# Patient Record
Sex: Male | Born: 1975 | Race: White | Hispanic: No | Marital: Married | Smoking: Former smoker
Health system: Southern US, Community
[De-identification: ages and names within clinical notes are randomized; demographics above are authoritative.]

## PROBLEM LIST (undated history)

## (undated) DIAGNOSIS — G4733 Obstructive sleep apnea (adult) (pediatric): Secondary | ICD-10-CM

## (undated) DIAGNOSIS — F329 Major depressive disorder, single episode, unspecified: Secondary | ICD-10-CM

## (undated) DIAGNOSIS — G8929 Other chronic pain: Secondary | ICD-10-CM

## (undated) DIAGNOSIS — I10 Essential (primary) hypertension: Secondary | ICD-10-CM

## (undated) DIAGNOSIS — I2699 Other pulmonary embolism without acute cor pulmonale: Secondary | ICD-10-CM

## (undated) DIAGNOSIS — Z86711 Personal history of pulmonary embolism: Secondary | ICD-10-CM

## (undated) DIAGNOSIS — G629 Polyneuropathy, unspecified: Secondary | ICD-10-CM

## (undated) DIAGNOSIS — F32A Depression, unspecified: Secondary | ICD-10-CM

## (undated) DIAGNOSIS — K219 Gastro-esophageal reflux disease without esophagitis: Secondary | ICD-10-CM

## (undated) HISTORY — DX: Morbid (severe) obesity due to excess calories: E66.01

## (undated) HISTORY — DX: Personal history of pulmonary embolism: Z86.711

## (undated) HISTORY — DX: Obstructive sleep apnea (adult) (pediatric): G47.33

## (undated) HISTORY — DX: Essential (primary) hypertension: I10

## (undated) HISTORY — DX: Major depressive disorder, single episode, unspecified: F32.9

## (undated) HISTORY — DX: Other chronic pain: G89.29

## (undated) HISTORY — PX: HERNIA REPAIR: SHX51

## (undated) HISTORY — DX: Polyneuropathy, unspecified: G62.9

## (undated) HISTORY — DX: Gastro-esophageal reflux disease without esophagitis: K21.9

## (undated) HISTORY — DX: Other pulmonary embolism without acute cor pulmonale: I26.99

## (undated) HISTORY — DX: Depression, unspecified: F32.A

---

## 2004-09-10 ENCOUNTER — Emergency Department: Payer: Self-pay | Admitting: Emergency Medicine

## 2005-11-18 ENCOUNTER — Emergency Department: Payer: Self-pay | Admitting: Emergency Medicine

## 2007-09-14 ENCOUNTER — Emergency Department: Payer: Self-pay | Admitting: Emergency Medicine

## 2007-12-23 ENCOUNTER — Emergency Department: Payer: Self-pay | Admitting: Emergency Medicine

## 2008-11-29 ENCOUNTER — Emergency Department: Payer: Self-pay | Admitting: Emergency Medicine

## 2009-09-20 ENCOUNTER — Emergency Department: Payer: Self-pay | Admitting: Emergency Medicine

## 2009-11-13 ENCOUNTER — Emergency Department: Payer: Self-pay | Admitting: Unknown Physician Specialty

## 2009-11-15 ENCOUNTER — Emergency Department: Payer: Self-pay | Admitting: Emergency Medicine

## 2009-12-26 ENCOUNTER — Ambulatory Visit: Payer: Self-pay | Admitting: Gastroenterology

## 2010-01-13 ENCOUNTER — Ambulatory Visit: Payer: Self-pay | Admitting: Surgery

## 2010-01-16 LAB — PATHOLOGY REPORT

## 2011-10-20 ENCOUNTER — Emergency Department: Payer: Self-pay | Admitting: Emergency Medicine

## 2011-10-27 ENCOUNTER — Emergency Department: Payer: Self-pay | Admitting: Emergency Medicine

## 2011-10-30 ENCOUNTER — Emergency Department: Payer: Self-pay | Admitting: Emergency Medicine

## 2011-11-01 ENCOUNTER — Emergency Department: Payer: Self-pay | Admitting: Emergency Medicine

## 2011-11-01 LAB — COMPREHENSIVE METABOLIC PANEL
Albumin: 3 g/dL — ABNORMAL LOW (ref 3.4–5.0)
Alkaline Phosphatase: 93 U/L (ref 50–136)
Anion Gap: 4 — ABNORMAL LOW (ref 7–16)
BUN: 19 mg/dL — ABNORMAL HIGH (ref 7–18)
Creatinine: 1 mg/dL (ref 0.60–1.30)
Glucose: 91 mg/dL (ref 65–99)
Osmolality: 279 (ref 275–301)
Potassium: 4.1 mmol/L (ref 3.5–5.1)
SGOT(AST): 28 U/L (ref 15–37)
Sodium: 139 mmol/L (ref 136–145)
Total Protein: 7 g/dL (ref 6.4–8.2)

## 2011-11-01 LAB — CBC WITH DIFFERENTIAL/PLATELET
Basophil #: 0 10*3/uL (ref 0.0–0.1)
Eosinophil #: 0.1 10*3/uL (ref 0.0–0.7)
HCT: 42 % (ref 40.0–52.0)
HGB: 14.1 g/dL (ref 13.0–18.0)
Lymphocyte %: 12.6 %
MCH: 29.6 pg (ref 26.0–34.0)
MCHC: 33.6 g/dL (ref 32.0–36.0)
MCV: 88 fL (ref 80–100)
Neutrophil #: 9.4 10*3/uL — ABNORMAL HIGH (ref 1.4–6.5)
RBC: 4.77 10*6/uL (ref 4.40–5.90)
WBC: 12.1 10*3/uL — ABNORMAL HIGH (ref 3.8–10.6)

## 2011-11-05 ENCOUNTER — Emergency Department: Payer: Self-pay | Admitting: Emergency Medicine

## 2011-11-05 LAB — BASIC METABOLIC PANEL
BUN: 21 mg/dL — ABNORMAL HIGH (ref 7–18)
Chloride: 106 mmol/L (ref 98–107)
Co2: 30 mmol/L (ref 21–32)
Creatinine: 1.05 mg/dL (ref 0.60–1.30)
Osmolality: 282 (ref 275–301)
Potassium: 4.4 mmol/L (ref 3.5–5.1)
Sodium: 140 mmol/L (ref 136–145)

## 2011-11-05 LAB — CBC
HCT: 42.6 % (ref 40.0–52.0)
HGB: 14.4 g/dL (ref 13.0–18.0)
MCH: 29.6 pg (ref 26.0–34.0)
MCHC: 33.9 g/dL (ref 32.0–36.0)
MCV: 87 fL (ref 80–100)
Platelet: 254 10*3/uL (ref 150–440)
RBC: 4.88 10*6/uL (ref 4.40–5.90)
WBC: 11.2 10*3/uL — ABNORMAL HIGH (ref 3.8–10.6)

## 2011-11-07 LAB — CULTURE, BLOOD (SINGLE)

## 2011-11-08 ENCOUNTER — Encounter: Payer: Self-pay | Admitting: Nurse Practitioner

## 2011-11-08 ENCOUNTER — Encounter: Payer: Self-pay | Admitting: Cardiothoracic Surgery

## 2011-11-27 ENCOUNTER — Encounter: Payer: Self-pay | Admitting: Cardiothoracic Surgery

## 2011-11-27 ENCOUNTER — Encounter: Payer: Self-pay | Admitting: Nurse Practitioner

## 2011-11-28 LAB — COMPREHENSIVE METABOLIC PANEL
Albumin: 3.1 g/dL — ABNORMAL LOW (ref 3.4–5.0)
Anion Gap: 7 (ref 7–16)
BUN: 22 mg/dL — ABNORMAL HIGH (ref 7–18)
Bilirubin,Total: 0.9 mg/dL (ref 0.2–1.0)
Calcium, Total: 9 mg/dL (ref 8.5–10.1)
Chloride: 106 mmol/L (ref 98–107)
EGFR (Non-African Amer.): >60
Glucose: 105 mg/dL — ABNORMAL HIGH (ref 65–99)
Osmolality: 283 (ref 275–301)
SGOT(AST): 22 U/L (ref 15–37)
SGPT (ALT): 66 U/L (ref 12–78)
Total Protein: 7.6 g/dL (ref 6.4–8.2)

## 2011-11-28 LAB — URINALYSIS, COMPLETE
Blood: NEGATIVE
Glucose,UR: NEGATIVE mg/dL (ref 0–75)
Ketone: NEGATIVE
Leukocyte Esterase: NEGATIVE
Ph: 5 (ref 4.5–8.0)
Protein: 30
Specific Gravity: 1.034 (ref 1.003–1.030)
WBC UR: 1 /HPF (ref 0–5)

## 2011-11-28 LAB — CBC
HGB: 14.1 g/dL (ref 13.0–18.0)
MCH: 29.6 pg (ref 26.0–34.0)
MCHC: 34.2 g/dL (ref 32.0–36.0)
MCV: 86 fL (ref 80–100)
Platelet: 277 10*3/uL (ref 150–440)
RBC: 4.78 10*6/uL (ref 4.40–5.90)
RDW: 13.2 % (ref 11.5–14.5)
WBC: 14.7 10*3/uL — ABNORMAL HIGH (ref 3.8–10.6)

## 2011-11-28 LAB — PROTIME-INR
INR: 1.1
Prothrombin Time: 14.6 secs (ref 11.5–14.7)

## 2011-11-28 LAB — TROPONIN I: Troponin-I: <0.02 ng/mL

## 2011-11-29 ENCOUNTER — Inpatient Hospital Stay: Payer: Self-pay | Admitting: Student

## 2011-11-29 LAB — APTT
Activated PTT: 65.5 secs — ABNORMAL HIGH (ref 23.6–35.9)
Activated PTT: 58.5 secs — ABNORMAL HIGH (ref 23.6–35.9)

## 2011-11-30 LAB — CBC WITH DIFFERENTIAL/PLATELET
Basophil %: 0.4 %
Eosinophil #: 0.2 10*3/uL (ref 0.0–0.7)
Eosinophil %: 1.8 %
HCT: 36.8 % — ABNORMAL LOW (ref 40.0–52.0)
HGB: 12.2 g/dL — ABNORMAL LOW (ref 13.0–18.0)
Lymphocyte #: 1.6 10*3/uL (ref 1.0–3.6)
MCHC: 33.2 g/dL (ref 32.0–36.0)
MCV: 87 fL (ref 80–100)
Monocyte #: 1 x10 3/mm (ref 0.2–1.0)
Neutrophil #: 8.2 10*3/uL — ABNORMAL HIGH (ref 1.4–6.5)
RBC: 4.23 10*6/uL — ABNORMAL LOW (ref 4.40–5.90)
WBC: 11.1 10*3/uL — ABNORMAL HIGH (ref 3.8–10.6)

## 2011-11-30 LAB — APTT
Activated PTT: 60.8 secs — ABNORMAL HIGH (ref 23.6–35.9)
Activated PTT: 70.7 secs — ABNORMAL HIGH (ref 23.6–35.9)

## 2011-12-28 DIAGNOSIS — I2699 Other pulmonary embolism without acute cor pulmonale: Secondary | ICD-10-CM

## 2011-12-28 DIAGNOSIS — Z86711 Personal history of pulmonary embolism: Secondary | ICD-10-CM | POA: Insufficient documentation

## 2011-12-28 HISTORY — DX: Personal history of pulmonary embolism: Z86.711

## 2011-12-28 HISTORY — DX: Other pulmonary embolism without acute cor pulmonale: I26.99

## 2012-01-18 ENCOUNTER — Ambulatory Visit: Payer: Self-pay | Admitting: Hematology and Oncology

## 2012-01-18 LAB — TSH: Thyroid Stimulating Horm: 1.78 u[IU]/mL

## 2012-01-27 ENCOUNTER — Ambulatory Visit: Payer: Self-pay | Admitting: Hematology and Oncology

## 2012-02-02 ENCOUNTER — Observation Stay: Payer: Self-pay | Admitting: Internal Medicine

## 2012-02-02 LAB — COMPREHENSIVE METABOLIC PANEL
Albumin: 3.6 g/dL (ref 3.4–5.0)
Alkaline Phosphatase: 102 U/L (ref 50–136)
Calcium, Total: 8.7 mg/dL (ref 8.5–10.1)
Co2: 27 mmol/L (ref 21–32)
Creatinine: 0.95 mg/dL (ref 0.60–1.30)
EGFR (African American): >60
EGFR (Non-African Amer.): >60
Potassium: 3.9 mmol/L (ref 3.5–5.1)
SGPT (ALT): 44 U/L (ref 12–78)

## 2012-02-02 LAB — CBC
HGB: 13.6 g/dL (ref 13.0–18.0)
MCV: 85 fL (ref 80–100)
RBC: 4.75 10*6/uL (ref 4.40–5.90)
WBC: 9.3 10*3/uL (ref 3.8–10.6)

## 2012-02-02 LAB — APTT: Activated PTT: 35.4 secs (ref 23.6–35.9)

## 2012-02-02 LAB — TROPONIN I: Troponin-I: <0.02 ng/mL

## 2012-02-02 LAB — MAGNESIUM: Magnesium: 1.9 mg/dL

## 2012-02-02 LAB — PROTIME-INR: INR: 1.5

## 2012-02-02 LAB — CK TOTAL AND CKMB (NOT AT ARMC): CK-MB: 0.7 ng/mL (ref 0.5–3.6)

## 2012-02-03 LAB — TROPONIN I: Troponin-I: <0.02 ng/mL

## 2012-02-27 ENCOUNTER — Ambulatory Visit: Payer: Self-pay | Admitting: Hematology and Oncology

## 2012-05-06 DIAGNOSIS — I2699 Other pulmonary embolism without acute cor pulmonale: Secondary | ICD-10-CM | POA: Insufficient documentation

## 2012-08-26 ENCOUNTER — Ambulatory Visit: Payer: Self-pay | Admitting: Hematology and Oncology

## 2012-08-27 ENCOUNTER — Ambulatory Visit: Payer: Self-pay

## 2012-08-28 ENCOUNTER — Ambulatory Visit: Payer: Self-pay

## 2012-09-12 LAB — CBC CANCER CENTER
Basophil %: 0.4 %
Eosinophil %: 5.7 %
HCT: 39.8 % — ABNORMAL LOW (ref 40.0–52.0)
HGB: 13.7 g/dL (ref 13.0–18.0)
MCH: 29.8 pg (ref 26.0–34.0)
MCV: 87 fL (ref 80–100)
Monocyte %: 8 %
Neutrophil #: 4.1 x10 3/mm (ref 1.4–6.5)
Neutrophil %: 53.6 %
Platelet: 186 x10 3/mm (ref 150–440)
RBC: 4.6 10*6/uL (ref 4.40–5.90)
RDW: 14.2 % (ref 11.5–14.5)

## 2012-09-26 ENCOUNTER — Ambulatory Visit: Payer: Self-pay | Admitting: Hematology and Oncology

## 2012-09-30 ENCOUNTER — Ambulatory Visit: Payer: Self-pay | Admitting: Family Medicine

## 2012-10-20 ENCOUNTER — Ambulatory Visit: Payer: Self-pay | Admitting: Pain Medicine

## 2012-10-23 ENCOUNTER — Other Ambulatory Visit: Payer: Self-pay | Admitting: Pain Medicine

## 2012-10-23 LAB — MAGNESIUM: Magnesium: 1.7 mg/dL — ABNORMAL LOW

## 2012-11-04 ENCOUNTER — Ambulatory Visit: Payer: Self-pay | Admitting: Pain Medicine

## 2012-11-11 ENCOUNTER — Emergency Department: Payer: Self-pay | Admitting: Emergency Medicine

## 2012-11-11 LAB — BASIC METABOLIC PANEL
Anion Gap: 6 — ABNORMAL LOW (ref 7–16)
BUN: 24 mg/dL — ABNORMAL HIGH (ref 7–18)
Calcium, Total: 8.7 mg/dL (ref 8.5–10.1)
Co2: 27 mmol/L (ref 21–32)
EGFR (Non-African Amer.): >60
Osmolality: 282 (ref 275–301)

## 2012-11-11 LAB — TROPONIN I: Troponin-I: <0.02 ng/mL

## 2012-11-11 LAB — CBC
HCT: 42.1 % (ref 40.0–52.0)
MCV: 87 fL (ref 80–100)
Platelet: 222 10*3/uL (ref 150–440)
RBC: 4.85 10*6/uL (ref 4.40–5.90)
RDW: 14 % (ref 11.5–14.5)
WBC: 13 10*3/uL — ABNORMAL HIGH (ref 3.8–10.6)

## 2012-11-19 ENCOUNTER — Ambulatory Visit: Payer: Self-pay | Admitting: Pain Medicine

## 2012-11-21 ENCOUNTER — Ambulatory Visit: Payer: Self-pay | Admitting: Hematology and Oncology

## 2012-11-26 ENCOUNTER — Ambulatory Visit: Payer: Self-pay | Admitting: Hematology and Oncology

## 2012-11-26 HISTORY — PX: CARDIOVASCULAR STRESS TEST: SHX262

## 2012-12-08 ENCOUNTER — Emergency Department: Payer: Self-pay | Admitting: Emergency Medicine

## 2012-12-08 LAB — CBC
HGB: 14 g/dL (ref 13.0–18.0)
MCHC: 34.3 g/dL (ref 32.0–36.0)
MCV: 86 fL (ref 80–100)
RDW: 13.7 % (ref 11.5–14.5)

## 2012-12-08 LAB — BASIC METABOLIC PANEL
Anion Gap: 4 — ABNORMAL LOW (ref 7–16)
BUN: 16 mg/dL (ref 7–18)
Co2: 30 mmol/L (ref 21–32)
Creatinine: 1.02 mg/dL (ref 0.60–1.30)
EGFR (African American): >60
EGFR (Non-African Amer.): >60
Glucose: 87 mg/dL (ref 65–99)
Osmolality: 278 (ref 275–301)
Potassium: 3.7 mmol/L (ref 3.5–5.1)
Sodium: 139 mmol/L (ref 136–145)

## 2012-12-08 LAB — TROPONIN I: Troponin-I: <0.02 ng/mL

## 2012-12-08 LAB — PRO B NATRIURETIC PEPTIDE: B-Type Natriuretic Peptide: 21 pg/mL (ref 0–125)

## 2012-12-12 ENCOUNTER — Ambulatory Visit: Payer: Self-pay | Admitting: Pain Medicine

## 2012-12-13 ENCOUNTER — Emergency Department: Payer: Self-pay | Admitting: Emergency Medicine

## 2012-12-13 LAB — CBC
HCT: 42.7 % (ref 40.0–52.0)
HGB: 14.4 g/dL (ref 13.0–18.0)
MCH: 29 pg (ref 26.0–34.0)
MCV: 86 fL (ref 80–100)
Platelet: 212 10*3/uL (ref 150–440)
RBC: 4.98 10*6/uL (ref 4.40–5.90)
RDW: 13.4 % (ref 11.5–14.5)

## 2012-12-14 LAB — BASIC METABOLIC PANEL
BUN: 21 mg/dL — ABNORMAL HIGH (ref 7–18)
Calcium, Total: 9.1 mg/dL (ref 8.5–10.1)
Chloride: 104 mmol/L (ref 98–107)
Co2: 29 mmol/L (ref 21–32)
EGFR (African American): >60

## 2012-12-14 LAB — CK TOTAL AND CKMB (NOT AT ARMC)
CK, Total: 335 U/L — ABNORMAL HIGH (ref 35–232)
CK-MB: 2.1 ng/mL (ref 0.5–3.6)

## 2012-12-14 LAB — LIPASE, BLOOD: Lipase: 130 U/L (ref 73–393)

## 2012-12-14 LAB — TROPONIN I
Troponin-I: <0.02 ng/mL
Troponin-I: <0.02 ng/mL

## 2012-12-14 LAB — PROTIME-INR
INR: 1.2
Prothrombin Time: 15.4 secs — ABNORMAL HIGH (ref 11.5–14.7)

## 2013-02-04 ENCOUNTER — Ambulatory Visit: Payer: Self-pay | Admitting: Pain Medicine

## 2013-02-20 ENCOUNTER — Ambulatory Visit: Payer: Self-pay | Admitting: Hematology and Oncology

## 2013-02-24 LAB — COMPREHENSIVE METABOLIC PANEL
Albumin: 3.5 g/dL (ref 3.4–5.0)
Alkaline Phosphatase: 92 U/L
Anion Gap: 3 — ABNORMAL LOW (ref 7–16)
BUN: 15 mg/dL (ref 7–18)
Bilirubin,Total: 0.2 mg/dL (ref 0.2–1.0)
Calcium, Total: 9 mg/dL (ref 8.5–10.1)
Co2: 32 mmol/L (ref 21–32)
Glucose: 104 mg/dL — ABNORMAL HIGH (ref 65–99)
Osmolality: 282 (ref 275–301)
Potassium: 4.3 mmol/L (ref 3.5–5.1)
SGOT(AST): 33 U/L (ref 15–37)
Sodium: 141 mmol/L (ref 136–145)
Total Protein: 6.8 g/dL (ref 6.4–8.2)

## 2013-02-24 LAB — CBC CANCER CENTER
Basophil #: 0.1 x10 3/mm (ref 0.0–0.1)
Basophil %: 0.7 %
Eosinophil %: 4.6 %
HCT: 43.1 % (ref 40.0–52.0)
HGB: 13.9 g/dL (ref 13.0–18.0)
Lymphocyte #: 2.6 x10 3/mm (ref 1.0–3.6)
Lymphocyte %: 25.9 %
MCH: 27.9 pg (ref 26.0–34.0)
MCV: 87 fL (ref 80–100)
Monocyte %: 6.5 %
Neutrophil #: 6.3 x10 3/mm (ref 1.4–6.5)
Platelet: 213 x10 3/mm (ref 150–440)
RBC: 4.99 10*6/uL (ref 4.40–5.90)
RDW: 13.7 % (ref 11.5–14.5)
WBC: 10.1 x10 3/mm (ref 3.8–10.6)

## 2013-02-26 ENCOUNTER — Ambulatory Visit: Payer: Self-pay | Admitting: Hematology and Oncology

## 2013-03-01 ENCOUNTER — Emergency Department: Payer: Self-pay | Admitting: Emergency Medicine

## 2013-03-01 LAB — CBC
HCT: 41.7 % (ref 40.0–52.0)
HGB: 13.9 g/dL (ref 13.0–18.0)
MCH: 28.5 pg (ref 26.0–34.0)
MCHC: 33.3 g/dL (ref 32.0–36.0)
MCV: 86 fL (ref 80–100)
Platelet: 209 10*3/uL (ref 150–440)
RBC: 4.87 10*6/uL (ref 4.40–5.90)
RDW: 13.9 % (ref 11.5–14.5)
WBC: 10.2 10*3/uL (ref 3.8–10.6)

## 2013-03-01 LAB — BASIC METABOLIC PANEL
ANION GAP: 3 — AB (ref 7–16)
BUN: 24 mg/dL — AB (ref 7–18)
CO2: 31 mmol/L (ref 21–32)
CREATININE: 1.06 mg/dL (ref 0.60–1.30)
Calcium, Total: 8.8 mg/dL (ref 8.5–10.1)
Chloride: 105 mmol/L (ref 98–107)
EGFR (African American): >60
EGFR (Non-African Amer.): >60
Glucose: 100 mg/dL — ABNORMAL HIGH (ref 65–99)
OSMOLALITY: 282 (ref 275–301)
Potassium: 4 mmol/L (ref 3.5–5.1)
Sodium: 139 mmol/L (ref 136–145)

## 2013-03-01 LAB — PROTIME-INR
INR: 1.4
PROTHROMBIN TIME: 17.3 s — AB (ref 11.5–14.7)

## 2013-03-01 LAB — TROPONIN I: Troponin-I: <0.02 ng/mL

## 2013-03-01 LAB — APTT: Activated PTT: 34.2 secs (ref 23.6–35.9)

## 2013-06-19 ENCOUNTER — Ambulatory Visit: Payer: Self-pay | Admitting: Hematology and Oncology

## 2013-07-08 ENCOUNTER — Ambulatory Visit: Payer: Self-pay | Admitting: Hematology and Oncology

## 2013-07-08 LAB — COMPREHENSIVE METABOLIC PANEL
Albumin: 3.5 g/dL (ref 3.4–5.0)
Alkaline Phosphatase: 104 U/L
Anion Gap: 7 (ref 7–16)
BILIRUBIN TOTAL: 0.4 mg/dL (ref 0.2–1.0)
BUN: 19 mg/dL — AB (ref 7–18)
CALCIUM: 8.6 mg/dL (ref 8.5–10.1)
CREATININE: 1.01 mg/dL (ref 0.60–1.30)
Chloride: 104 mmol/L (ref 98–107)
Co2: 31 mmol/L (ref 21–32)
EGFR (African American): >60
EGFR (Non-African Amer.): >60
Glucose: 111 mg/dL — ABNORMAL HIGH (ref 65–99)
Osmolality: 286 (ref 275–301)
Potassium: 4.2 mmol/L (ref 3.5–5.1)
SGOT(AST): 27 U/L (ref 15–37)
SGPT (ALT): 42 U/L (ref 12–78)
SODIUM: 142 mmol/L (ref 136–145)
TOTAL PROTEIN: 7.1 g/dL (ref 6.4–8.2)

## 2013-07-08 LAB — CBC CANCER CENTER
Basophil #: 0.1 x10 3/mm (ref 0.0–0.1)
Basophil %: 0.6 %
EOS ABS: 0.4 x10 3/mm (ref 0.0–0.7)
Eosinophil %: 4.2 %
HCT: 43.2 % (ref 40.0–52.0)
HGB: 14.3 g/dL (ref 13.0–18.0)
LYMPHS PCT: 23.6 %
Lymphocyte #: 2.2 x10 3/mm (ref 1.0–3.6)
MCH: 28.5 pg (ref 26.0–34.0)
MCHC: 33 g/dL (ref 32.0–36.0)
MCV: 86 fL (ref 80–100)
Monocyte #: 0.4 x10 3/mm (ref 0.2–1.0)
Monocyte %: 4.4 %
NEUTROS ABS: 6.1 x10 3/mm (ref 1.4–6.5)
NEUTROS PCT: 67.2 %
PLATELETS: 208 x10 3/mm (ref 150–440)
RBC: 5.01 10*6/uL (ref 4.40–5.90)
RDW: 14 % (ref 11.5–14.5)
WBC: 9.2 x10 3/mm (ref 3.8–10.6)

## 2013-07-27 ENCOUNTER — Ambulatory Visit: Payer: Self-pay | Admitting: Hematology and Oncology

## 2013-08-26 DEATH — deceased

## 2013-11-10 ENCOUNTER — Ambulatory Visit: Payer: Self-pay | Admitting: Internal Medicine

## 2014-06-15 NOTE — H&P (Signed)
PATIENT NAME:  Jack Sullivan, Jack Sullivan MR#:  [id number] DATE OF BIRTH:  22-Jun-1975  DATE OF ADMISSION:  11/29/2011  PRIMARY CARE PHYSICIAN: Dr. Vonita Moss.   CHIEF COMPLAINT: Right right-sided flank muscle spasms and pain.   HISTORY OF PRESENT ILLNESS: The patient is a 39 year old Caucasian male with no significant past medical history other than a recent leg injury at work on 08/24 presents with one day's duration of sharp right-sided flank pain. The patient notes that he was injured at work 08/24 after a metallic object landed on his right calf and essentially "split my leg". He required 17 stitches and has been him out of work ever since. He follows at the wound care center for this wound. Since this injury, he has pretty much at home, not very active he reports. On the day of presentation he notes that he developed sharp pain on the right flank. He relays his hands over the lower ribs such as ranging to lower ribs about 8 through 10 at the site of pain. He notes that the pain was severe. On a scale of 1-10 was 12, unbearable, and was sharp shooting in nature, radiating intermittently to his right shoulder. He had associated sweats with feeling cold and clammy, diaphoretic, significant shortness of breath and felt he had palpitations, so presented to the Emergency Department. Due to his symptoms, he has had no p.o. intake today because of the severity of the pain.   On arrival to the Emergency Department he was noted to have bilateral pulmonary emboli.   He denies long-distance travel as well as any family history of blood clots. He denies hemoptysis. He did have some mild, intermittent cough, but denies hemoptysis or wheezing. He admits to painful respirations.   PAST MEDICAL HISTORY: Recent right leg wound deep laceration due to metal.    MEDICATIONS: None.   ALLERGIES: Flexeril causes memory loss.   PAST SURGICAL HISTORY:  1. Umbilical hernia repair in November 2011.  2. Calcium stone excision  from underneath his tongue.  3. Right calf laceration repair status post 17 stitches 10/20/2011.   FAMILY HISTORY: Father with hypertension, myocardial infarction. First myocardial infarction at age 98 and associated congestive heart failure. Grandfather also has hypertension as well as diabetes. Mother has hypertension, emphysema and bladder cancer. One brother has hypertension. He denies any family history of clots.   SOCIAL HISTORY: Married. Denies tobacco, alcohol or illicit drug use. Works as a Chartered certified accountant.   REVIEW OF SYSTEMS: CONSTITUTIONAL: Denies fevers. Admits to fatigue. Denies weight loss or weight gain. EYES: Denies blurred or double vision. Denies eye pain. ENT: Denies tinnitus, ear pain, epistaxis. RESPIRATORY: Admits to intermittent mild cough as well as significant dyspnea. Denies wheeze or hemoptysis. He admits to painful respirations. CARDIOVASCULAR: Admits to some right lower extremity edema that is mild due to his recent wound as well as palpitations today when he felt significantly short of breath. GASTROINTESTINAL: Denies nausea, vomiting, diarrhea, abdominal pain. ENDOCRINE: Admits to feeling cold and clammy, diaphoretic. INTEGUMENT: Denies any new skin rashes. MUSCULOSKELETAL: Admits to new right shoulder pain associated with this right flank pain. NEUROLOGIC: Denies numbness. PSYCH: Denies anxiety or depression   PHYSICAL EXAM:  VITAL SIGNS: Temperature 98.4, heart rate 128, respirations 24, blood pressure 160/81, sating 97% on room air.   GENERAL: The patient is a well-appearing Caucasian male in mild distress, spams of pain over his right flank.   HEAD: Normocephalic, atraumatic.   EYES: Anicteric sclerae. Normal eyelids, pupils equally  round and reactive to light.   ENT: Normal external ears and nares. Posterior oropharynx is clear without erythema or exudate. The mucous membrane is moist.   CARDIOVASCULAR: Normal S1, S2, regular rate and rhythm. No pretibial  edema noted. Radial and pedal pulses are intact. There is no JVD.   PULMONARY: There are normal breath sounds with normal respiratory effort. There are some decreased breath sounds right base.   ABDOMEN: Soft, nontender, nondistended with normal bowel sounds. No hepatomegaly.   SKIN: Normal color. No rash. He is diaphoretic. Skin is warm.   MUSCULOSKELETAL: Normal tone. There is no clubbing, cyanosis, or edema noted. There is a wound on the right posterior calf. Dressing is clean, dry, and intact. The surrounding skin is without erythema.    PSYCH: The patient is awake, alert, and oriented x3. Judgment and insight are intact.   LABORATORY, DIAGNOSTIC AND RADIOLOGIC DATA: BMP shows glucose 105, BUN 22, creatinine 0.91, sodium 140, potassium 4.2, chloride 106, bicarbonate 27, calcium 9, bilirubin 0.9, alkaline phosphatase 145, ALT 66, AST 22, total protein 7.6, albumin 3.1, osmolality 283, anion gap 7. CBC shows WBC count 14.7, hemoglobin 14.1, hematocrit 41.3, platelets 277, MCV of 86. Lipase 71. Troponin-I less than 0.02. PTT 32.6. INR 1.1, prothrombin 14.6. Urinalysis shows 30 mg/dl of protein, otherwise negative for nitrites and leukocyte esterase. There are four red blood cells per high-power field. EKG shows sinus tachycardia at 109 beats per minute. CT of the chest shows filling defects within both right and left lower lobe pulmonary artery branches consistent with pulmonary emboli. There are confluent alveolar densities peripherally in the right lower lobe in the lateral and posterior costophrenic gutter as well as the left lower lobe posteriorly indicating pulmonary infarction. There are small bilateral pleural effusions. There is mild enlargement of the cardiac chambers. There is moderate widening of the right hilar lymph node to 1.4 cm. There is a hiatal hernia.   CT of the abdomen and pelvis without contrast: No evidence of urinary tract stones or obstruction. No acute hepatobiliary  abnormality. No intra-abdominal or pelvic inflammatory process demonstrated. No evidence of small or large bowel obstruction or ileus. No intra-abdominal or pelvic lymphadenopathy demonstrated. Small bilateral pleural effusions as well as confluent alveolar density in both lower lobes of the lungs were identified.   ASSESSMENT:   1. Right flank pain due to bilateral pulmonary emboli with associated pulmonary infarction. 2. Leukocytosis.  3. Hiatal hernia.  4. Right lower extremity wound.   PLAN:  1. Bilateral pulmonary emboli with infarction. The patient has been started on a heparin drip, will continue this as well as start Coumadin for a goal INR between 2 to 3. The patient will be on dual anticoagulation until INR is therapeutic x2, 24 hours apart. He likely will be maintained on outpatient anticoagulation for six months. The etiology of his pulmonary embolus is most likely due to immobility due to his recent injury as he denies a family history of blood clots as well as recent travel and any other risk factors. His obesity could also be a risk factor. At this point, further work-up for underlying hypercoagulable disorder will be deferred until he has completed his initial treatment for his pulmonary emboli which should be at least six months. This is most likely a provoked pulmonary embolus. However, given his age, one may consider hypercoagulable work up again once his treatment course has been completed. We will manage his pain with his pain medications as needed.  2. Leukocytosis,  most likely reactive due to acute pulmonary embolus.  3. Hiatal hernia stable.  4. Right calf wound without evidence of active infection. The patient has completed a course of antibiotics in the outpatient setting. He is not currently on any antibiotics. We will place a wound care team consult for further wound management recommendations.   DISPOSITION: The patient is being admitted to an inpatient status given his  bilateral pulmonary emboli, as well as pulmonary infarction. Evaluation of his troponin does not show elevation, however, the patient does have enlarged cardiac chambers, so he could be at risk for decompensation. So, the patient will be admitted until his anticoagulation is therapeutic.   CODE STATUS: FULL CODE.   TIME SPENT ON ADMISSION: 55 minutes.  ____________________________ Aurther Loft, DO aeo:ap D: 11/29/2011 02:13:47 ET T: 11/29/2011 09:45:01 ET JOB#: [id number]  cc: Aurther Loft, DO, <Dictator> Steele Sizer, MD Mindy Gali E Lovelee Forner DO ELECTRONICALLY SIGNED 12/02/2011 1:18

## 2014-06-15 NOTE — Discharge Summary (Signed)
PATIENT NAME:  Jack Sullivan, [name] MR#:  [id number] DATE OF BIRTH:  1975-09-09  DATE OF ADMISSION:  02/02/2012 DATE OF DISCHARGE:  02/03/2012  ADMISSION DIAGNOSIS: Chest pain.    DISCHARGE DIAGNOSES:  1. Chest pain, likely secondary to known pulmonary emboli.  2. Known deep venous thrombosis and pulmonary emboli.  3. Gastroesophageal reflux disease.   LABORATORY, DIAGNOSTIC, AND RADIOLOGICAL DATA: Troponins x2 were negative.   CT chest showed interval decrease in pulmonary emboli burden. Residual segmental pulmonary emboli.   HOSPITAL COURSE: 39 year old male with a recent a history of bilateral pulmonary emboli on Xarelto secondary to deep vein thrombosis who presented with chest pain. For further details, please refer to the history and physical.  1. Chest pain. Patient was admitted to the hospital service. Telemetry was negative. Troponins were negative. He was chest pain free and was ambulating without chest pain.  2. History of pulmonary emboli and deep venous thrombosis. Patient will continue Xarelto.   3. History of gastroesophageal reflux disease, on PPI.   DISCHARGE MEDICATIONS:  1. Xarelto 20 mg daily.  2. Omeprazole 20 mg daily.  3. Acetaminophen oxycodone 325/5 q.6 hours p.r.n. pain.   DISCHARGE DIET: Low sodium.   DISCHARGE ACTIVITY: As tolerated.   DISCHARGE FOLLOW UP: Patient may follow up with his primary care physician, Dr. Vonita MossMark Crissman, in one week.  TIME SPENT: 35 minutes.   ____________________________ Janyth ContesSital P. Juliene PinaMody, MD spm:cms D: 02/03/2012 10:25:31 ET T: 02/03/2012 12:19:57 ET JOB#: [id number]  cc: Corean Yoshimura P. Juliene PinaMody, MD, <Dictator> Steele SizerMark A. Crissman, MD  Janyth ContesSITAL P Aydden Cumpian MD ELECTRONICALLY SIGNED 02/04/2012 7:33

## 2014-06-15 NOTE — Discharge Summary (Signed)
PATIENT NAME:  Jack Sullivan, Jack Sullivan MR#:  [id number] DATE OF BIRTH:  1976/01/21  DATE OF ADMISSION:  11/29/2011 DATE OF DISCHARGE:  11/30/2011  PRIMARY CARE PHYSICIAN: Dr. Vonita Moss.   CHIEF COMPLAINT: Right-sided flank pain and spasms.   DISCHARGE DIAGNOSES:  1. Bilateral pulmonary embolism with pulmonary infarcts.  2. Recent the right leg deep laceration.   DISCHARGE MEDICATIONS:  1. Percocet 325/5 mg, 1 tab every 6 hours as needed for pain.  2. Xarelto 15 mg, 1 tab b.i.d.  for 21 days, then 20 mg daily thereafter.   DISPOSITION: Home.   DIET: Low sodium.   ACTIVITY: As tolerated.   FOLLOWUP: Please follow with a primary care physician within a week. If  any bleeding including black tarry stools, stop Xarelto and call your doctor right away.   CODE STATUS:  The patient is FULL CODE.     HISTORY OF PRESENT ILLNESS: For full details of History and Physical, please see the dictation by Dr. Marc Morgans on 11/29/2011, but briefly this is a 39 year old Caucasian male with no significant past medical history who presented with the above chief complaint. The patient stated that in late August he did develop a calf injury requiring 17 stitches, and he has been following with the [facility]. After the injury, he had decreased his activity and was sitting on chairs and sofa for up to 16 hours a day. The patient presented with the above chief complaint, and routine work-up found he had pulmonary embolus with pulmonary infarcts, and he was admitted to the Hospitalist Service for further evaluation and management.   SIGNIFICANT LABORATORY, DIAGNOSTIC AND RADIOLOGICAL DATA:  Initial BUN 22, creatinine 0.91. LFTs: Alkaline phosphatase 145, albumin 3.1, otherwise within normal limits. Lipase 71. Troponin negative x1. Initial WBC 14.7. WBC of 11.1 on 10/04. Initial hemoglobin 14.1, hematocrit 41.3. Initial INR 1.1. Urinalysis showing no blood, 4 RBC, 1WBC, no nitrites or leukocyte esterase. CT of abdomen  with pelvis without contrast showing no evidence of urinary tract stones or obstruction. No acute hepatobiliary abnormality. No evidence of small or large bowel obstruction or ileus. Small bilateral pleural effusions. CT of the chest per PE protocol showing filling defects within both right and left lower lobe pulmonary artery branches consistent with PE, also evidence of pulmonary infarctions, small bilateral pleural effusions, mild enlargement of the cardiac chambers, mild enlargement of the right hilar lymph node, hiatal hernia.   HOSPITAL COURSE: The patient was admitted to the Hospitalist Service. He did have SIRS criteria including leukocytosis and tachycardia. That was likely in the setting of the PE found on initial work-up. The PE was likely in the setting of inactivity after sustaining his right leg wound. He was started on heparin and Coumadin; however, he remained afebrile without hypoxia, tachypnea and the tachycardia resolved. He is on room air. Xarelto was offered to him, and the  risks and benefits of Coumadin versus Xarelto were explained. He opted to go for the Xarelto and liked the idea of checking his blood less in regards to the INR. At this point, he will be discharged with Xarelto and outpatient follow up with his PCP. In regards to the right leg wound, he was seen by the Wound Care nurse and dressings applied, and he can go back to the Wound Care Center on Monday. The wound is healing nicely.   CODE STATUS:  The patient is a FULL CODE.     TOTAL TIME SPENT: 35 minutes.   ____________________________ Krystal Eaton,  MD sa:cbb D: 11/30/2011 10:39:17 ET T: 11/30/2011 10:59:51 ET JOB#: [id number]  cc: Krystal EatonShayiq Etta Gassett, MD, <Dictator> Steele SizerMark A. Crissman, MD Krystal EatonSHAYIQ Ivah Girardot MD ELECTRONICALLY SIGNED 11/30/2011 15:12

## 2014-06-15 NOTE — H&P (Signed)
PATIENT NAME:  Jack Sullivan, Jack Sullivan MR#:  [id number] DATE OF BIRTH:  01-Aug-1975  DATE OF ADMISSION:  02/02/2012  PRIMARY CARE PHYSICIAN: Dr. Dossie Arbour   REFERRING PHYSICIAN:  Dr. Enedina Finner  CHIEF COMPLAINT: Chest pain.   HISTORY OF PRESENT ILLNESS: 39 year old Caucasian male with a history of pulmonary embolus diagnosed this October, history of gastroesophageal reflux disease, presented to the ED with chest pain today. The patient was diagnosed with pulmonary embolus in October and has been treated with Xarelto.  He has had chest pain, on Percocet p.r.n. He said the chest pain this time is different.  It is tight, pressure-like, no radiation, initially on the right side and then on the left side. The patient denies any cough, sputum, shortness of breath, or hematemesis. No fever, chills, headache, or dizziness. No leg swelling or tenderness. No palpitations, orthopnea, or nocturnal dyspnea. The patient went to the hematologist's office and was given antibiotics.   PAST MEDICAL HISTORY:  1. Bilateral pulmonary emboli on Xarelto.   2. Gastroesophageal reflux disease.  PAST SURGICAL HISTORY:  1. Hernia repair. 2. Stoma excision from underneath his tongue. 3. Right calf laceration repair.    SOCIAL HISTORY: No smoking or drinking or illicit drugs.   FAMILY HISTORY: Father has hypertension, myocardial infarction, and congestive heart failure. Grandfather had hypertension and diabetes. Mother had hypertension, emphysema, and bladder cancer. Brother has hypertension.   REVIEW OF SYSTEMS: CONSTITUTIONAL: The patient denies any fever or chills. No headache or dizziness. No weakness. EYES: No double vision or blurred vision. ENT: No epistaxis, slurred speech, or dysphagia. CARDIOVASCULAR: Positive for chest pain but no palpitations, orthopnea, or nocturnal dyspnea. No leg edema. PULMONARY: No cough, sputum, shortness of breath, or hemoptysis. GASTROINTESTINAL: No abdominal pain, nausea, vomiting, or diarrhea. No  melena or bloody stool. GU: No dysuria, hematuria, or incontinence. SKIN: No rash or jaundice. HEMATOLOGIC: No easy bruising or bleeding. MUSCULOSKELETAL: No joint pain or edema. NEURO: No syncope, loss of consciousness, or seizure.   PHYSICAL EXAMINATION:  VITAL SIGNS: Temperature 97.2, blood pressure 138/74, pulse 71, respirations 18, oxygen saturation 100% on room air.   GENERAL: The patient is alert, awake, oriented, in no acute distress.   HEENT: Pupils round, equal, reactive to light and accommodation. Moist oral mucosa. Clear oropharynx.   NECK: Supple. No JVD or carotid bruit. No lymphadenopathy. No thyromegaly.   CARDIOVASCULAR: S1, S2. Regular rate, rhythm. No murmurs or gallops.   PULMONARY: Bilateral air entry. No wheezing or rales. No use of accessory muscles to breathe.   ABDOMEN: Soft. No distention or tenderness. No organomegaly. Bowel sounds present.   EXTREMITIES: No edema, clubbing, or cyanosis. No calf tenderness.   NEUROLOGY: Alert and oriented times three. No focal deficit. Power five out of five. Sensation intact.   SKIN: No rash or jaundice.   LABORATORY, DIAGNOSTIC, AND RADIOLOGICAL DATA: CT scan of the chest shows interval decrease in pulmonary emboli burden.  There are a few residual segmental pulmonary emboli.    CK 147, CK-MB 0.7, glucose 82, BUN 16, creatinine 0.95. Electrolytes are normal. CBC normal. Lipase 108, magnesium 1.9, INR 1.5. EKG showed normal sinus rhythm at 76 beats per minute.   IMPRESSION:  1. Chest pain, unclear etiology, possibly due to pulmonary emboli.  2. Bilateral pulmonary emboli.  3. Gastroesophageal reflux disease.  PLAN OF TREATMENT:  1. The patient will be placed on observation. We will continue telemonitor and we will follow up troponin level and continue Xarelto.  2. Pain control with Percocet  and morphine p.r.n.  3. We will give Protonix for GI prophylaxis.   Discussed the patient's situation and plan of treatment with  the patient and the patient's wife.   TIME SPENT: About 50 minutes.   ____________________________ Shaune PollackQing Deaunte Dente, MD qc:bjt D: 02/02/2012 14:37:20 ET T: 02/02/2012 15:01:08 ET JOB#: [id number]  cc: Shaune PollackQing Artemis Koller, MD, <Dictator> Steele SizerMark A. Crissman, MD Shaune PollackQING Noah Pelaez MD ELECTRONICALLY SIGNED 02/03/2012 12:42

## 2014-08-24 IMAGING — CT CT OF THE RIGHT TIBIA AND FIBULA WITH CONTRAST
1 series · 16 of 32 positions shown, 20 images · non-contrast
Comparison: none

REASON FOR EXAM: persistent pain, swelling, and elevated WBC s/p
laceration on [DATE] eval osteo or
COMMENTS:

PROCEDURE:     CT  - CT TIBIA / LOWER LEG RIGHT W  - November 05, 2011  [DATE]
RESULT:     History: Pain. Swelling and redness.
Comparison Study: No prior.

[Series 7: soft (id) · axial · 0.44mm/px · z∈[-378,+70]mm · 16 of 248 slices shown, 20 images]
[im 16/248  soft-tissue]
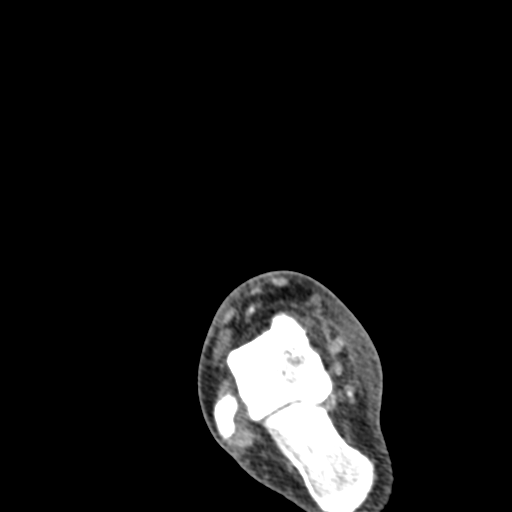
[im 16/248  bone]
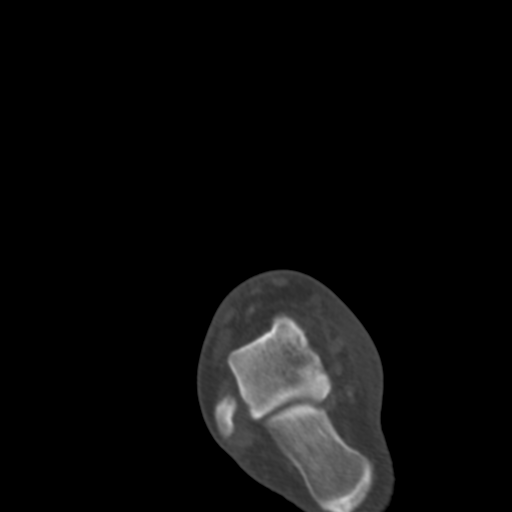
[im 32/248  soft-tissue]
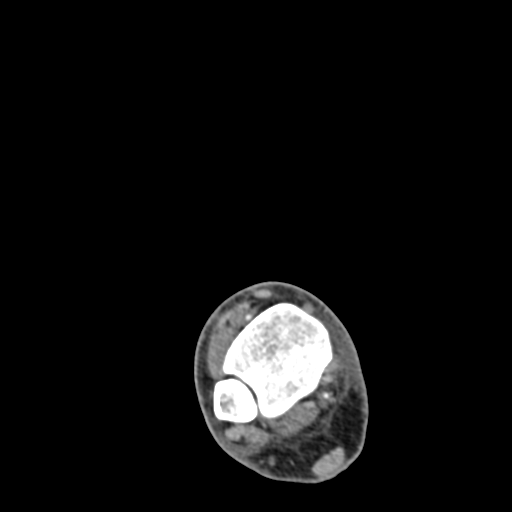
[im 48/248  soft-tissue]
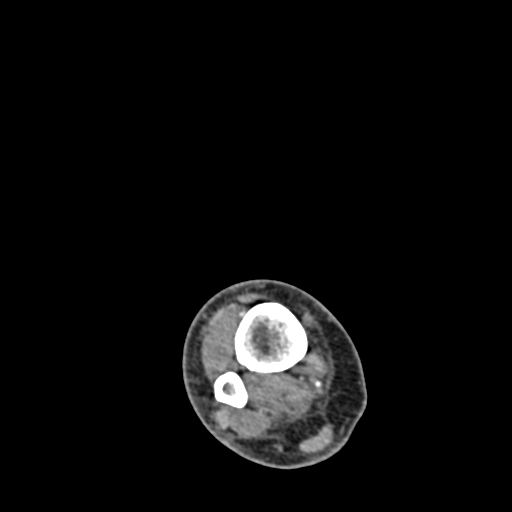
[im 64/248  soft-tissue]
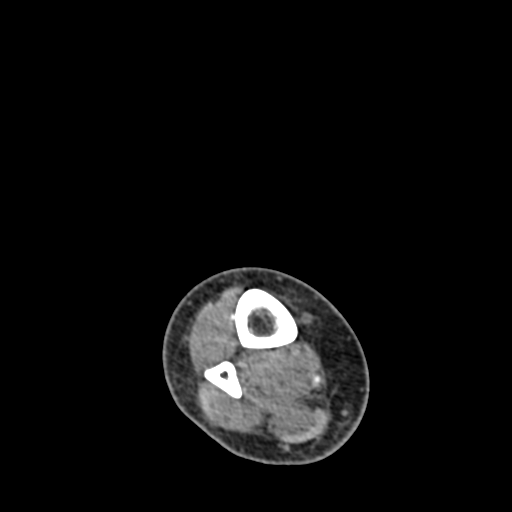
[im 80/248  soft-tissue]
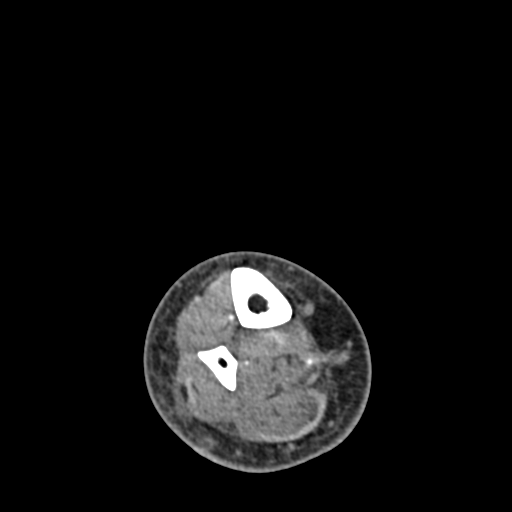
[im 96/248  soft-tissue]
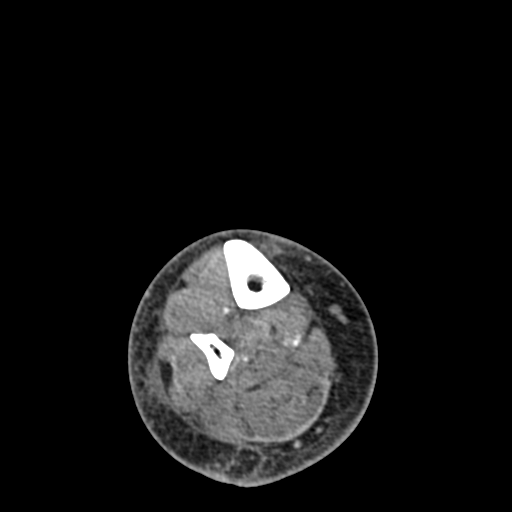
[im 112/248  soft-tissue]
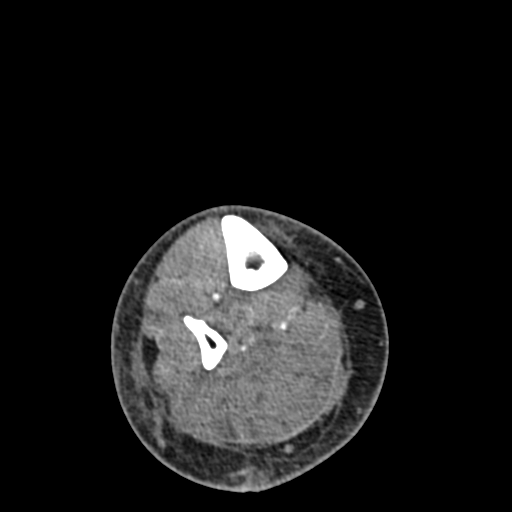
[im 136/248  soft-tissue]
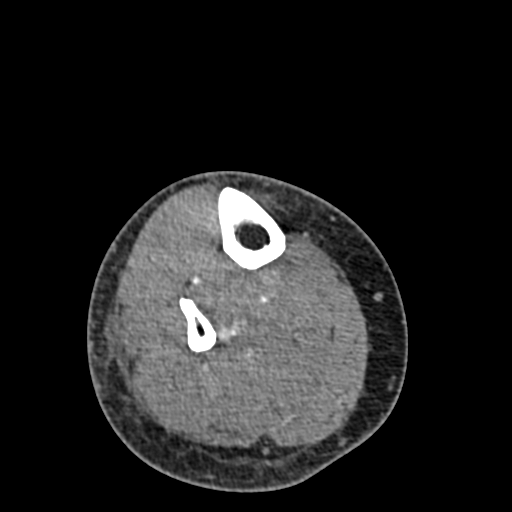
[im 152/248  soft-tissue]
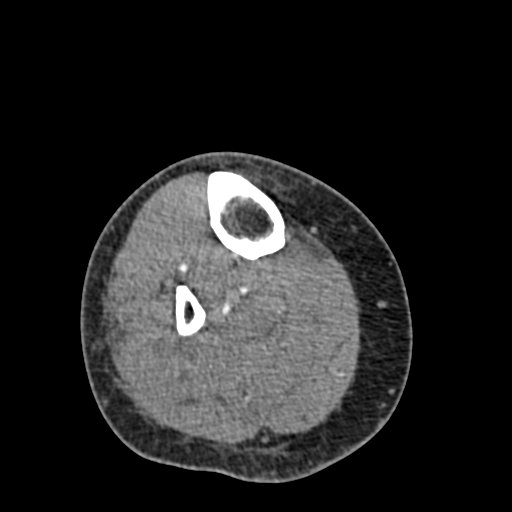
[im 152/248  bone]
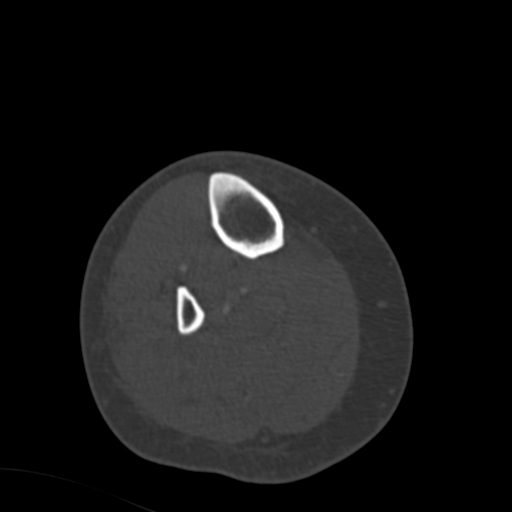
[im 168/248  soft-tissue]
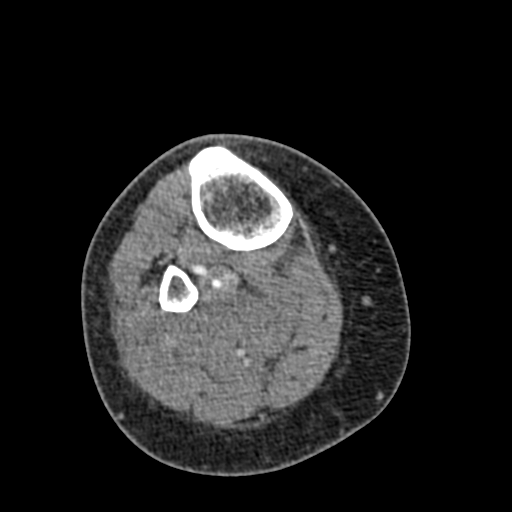
[im 184/248  soft-tissue]
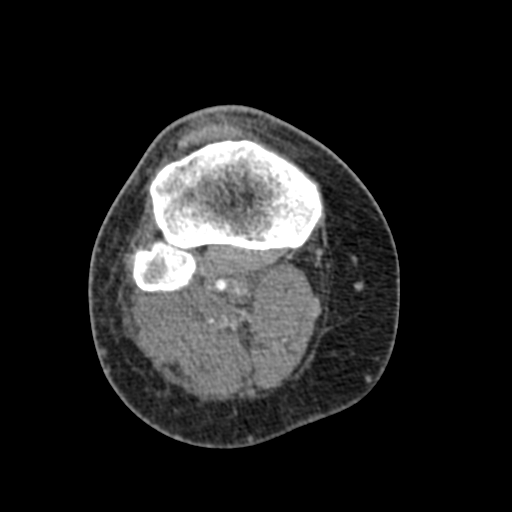
[im 200/248  soft-tissue]
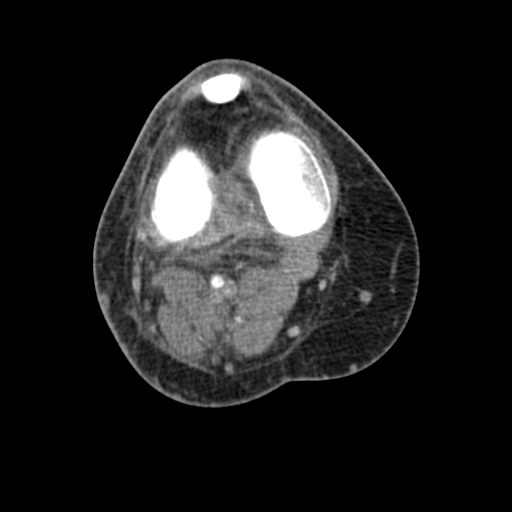
[im 216/248  soft-tissue]
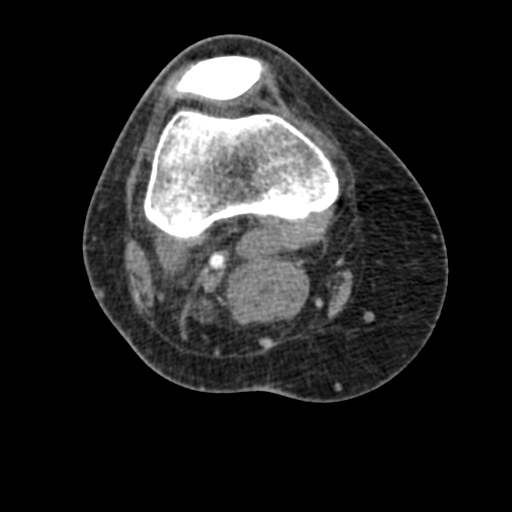
[im 216/248  lung]
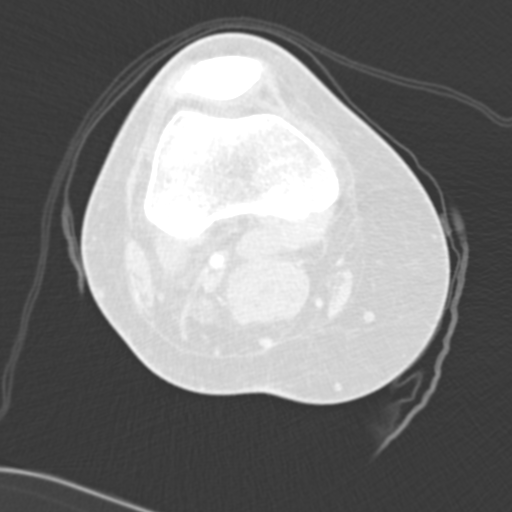
[im 224/248  lung]
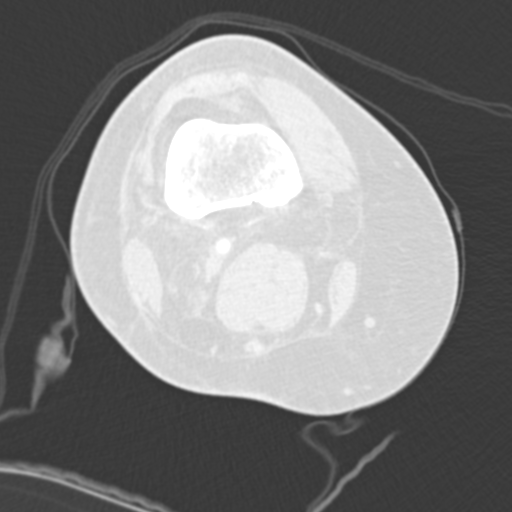
[im 232/248  soft-tissue]
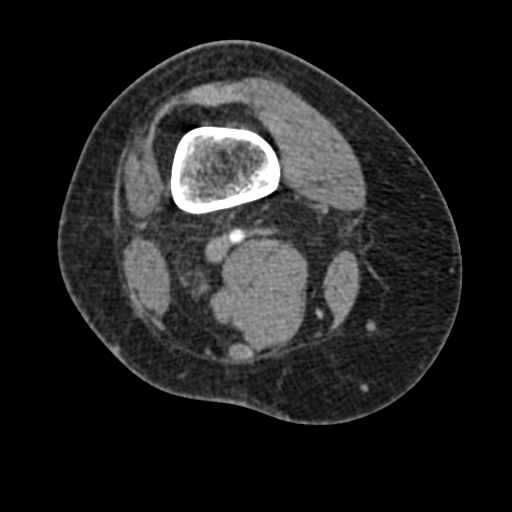
[im 232/248  lung]
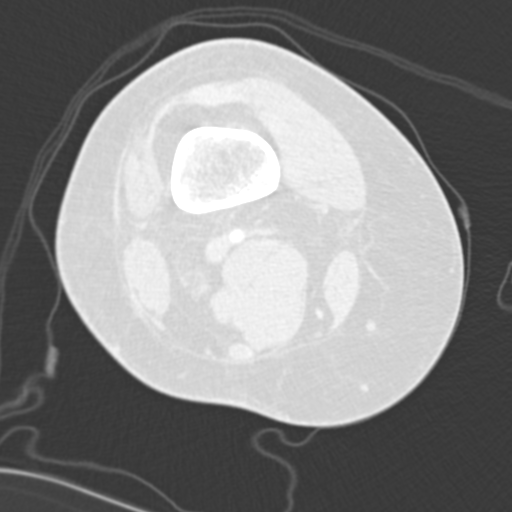
[im 240/248  lung]
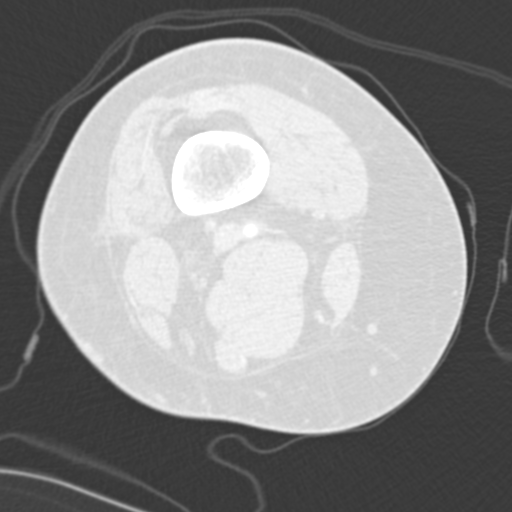

[16 of 32 positions shown; findings below may reference images not displayed]

FINDINGS: Standard CT obtained. Mild diffuse subcutaneous edema . This could
be from cellulitis or other etiologies of edema. No evidence of abscess. No
focal muscle edema. No acute bony abnormality.
IMPRESSION: Diffuse mild subcutaneous edema. This could be from
cellulitis. No evidence of abscess or other focal abnormality. No acute bony
abnormality.

## 2014-09-28 DIAGNOSIS — I10 Essential (primary) hypertension: Secondary | ICD-10-CM | POA: Insufficient documentation

## 2014-09-28 DIAGNOSIS — K219 Gastro-esophageal reflux disease without esophagitis: Secondary | ICD-10-CM | POA: Insufficient documentation

## 2014-09-28 DIAGNOSIS — G4733 Obstructive sleep apnea (adult) (pediatric): Secondary | ICD-10-CM | POA: Insufficient documentation

## 2014-09-28 DIAGNOSIS — G629 Polyneuropathy, unspecified: Secondary | ICD-10-CM | POA: Insufficient documentation

## 2014-09-28 DIAGNOSIS — G8929 Other chronic pain: Secondary | ICD-10-CM | POA: Insufficient documentation

## 2014-10-07 ENCOUNTER — Ambulatory Visit: Payer: Self-pay | Admitting: Family Medicine

## 2014-10-14 ENCOUNTER — Ambulatory Visit (INDEPENDENT_AMBULATORY_CARE_PROVIDER_SITE_OTHER): Payer: Self-pay | Admitting: Family Medicine

## 2014-10-14 ENCOUNTER — Encounter: Payer: Self-pay | Admitting: Family Medicine

## 2014-10-14 DIAGNOSIS — I2699 Other pulmonary embolism without acute cor pulmonale: Secondary | ICD-10-CM

## 2014-10-14 DIAGNOSIS — K219 Gastro-esophageal reflux disease without esophagitis: Secondary | ICD-10-CM

## 2014-10-14 DIAGNOSIS — G8929 Other chronic pain: Secondary | ICD-10-CM

## 2014-10-14 DIAGNOSIS — I10 Essential (primary) hypertension: Secondary | ICD-10-CM

## 2014-10-14 MED ORDER — OMEPRAZOLE 40 MG PO CPDR: 40.0000 mg | DELAYED_RELEASE_CAPSULE | Freq: Every day | ORAL | Status: DC

## 2014-10-14 MED ORDER — RIVAROXABAN 20 MG PO TABS: 20.0000 mg | ORAL_TABLET | Freq: Every day | ORAL | Status: DC

## 2014-10-14 MED ORDER — ESCITALOPRAM OXALATE 10 MG PO TABS: 10.0000 mg | ORAL_TABLET | Freq: Every day | ORAL | Status: DC

## 2014-10-14 MED ORDER — LISINOPRIL 20 MG PO TABS: 20.0000 mg | ORAL_TABLET | Freq: Every day | ORAL | Status: DC

## 2014-10-14 MED ORDER — AMLODIPINE BESYLATE 5 MG PO TABS: 5.0000 mg | ORAL_TABLET | Freq: Every day | ORAL | Status: DC

## 2014-10-14 NOTE — Patient Instructions (Addendum)
Try to lose weight and read through the information in the hand-out Return in 6 months Try to follow the DASH guidelines for the blood pressure

## 2014-10-14 NOTE — Assessment & Plan Note (Signed)
He knows what he needs to do he says; declined referral to nutritionist; limit empty calories; hand-out given from [email or web address]

## 2014-10-14 NOTE — Assessment & Plan Note (Signed)
Using TENS unit; no narcotics from this office

## 2014-10-14 NOTE — Progress Notes (Signed)
BP 125/78 mmHg  Pulse 77  Temp(Src) 97.3 F (36.3 C)  Ht  (1.702 m)  Wt 324 lb (146.965 kg)  BMI 50.73 kg/m2  SpO2 99%   Subjective:    Patient ID: Jack Sullivan, male    DOB: 29-Apr-1975, 39 y.o.   MRN: [id number]  HPI: Jack Sullivan is a 39 y.o. male  Chief Complaint  Patient presents with  . Hypertension  . Leg Pain    follow up leg pain   He is taking two medicines for BP; home checks 120s to 130s over 70s to 80s Does not add salt to anything; uses a lot of pepper He does not take any decongestants Last creatinine and potassium were in early February; reviewed labs from [facility] done July 17th and his creatinine and potassium were both normal  Morbid obesity and chronic leg pain; not really able to exercise; drinks water with sugar-free additive; he has not lost any significiant weight, but he has lost 6 pounds since February; he is trying to eat better  He had pain shooting in the back of the neck and had pain in the back of his head and chest tightness; he went to [facility]; they checked him for more blood clots; they did scans of the neck and chest and didn't find anything; he had not missed any doses of his blood thinner; nothing showed up they told him; four days of that and now better, not bad like it was; using TENS unit for the leg  Hx of pulmonary embolism; taking blood thinner; no nose bleeds, no gum bleeding, no rectal bleeding; does have easy bruising and bleeding  Reviewed lipid panel from 2015 and it was really favorable; LDL 79 and HDL 51  He has pretty bad heartburn if he misses PPI; on the omeprazole 40 mg daily; we've talked about this before  Relevant past medical, surgical, family and social history reviewed and updated as indicated. Interim medical history since our last visit reviewed. Allergies and medications reviewed and updated.  Review of Systems Per HPI unless specifically indicated above     Objective:    BP 125/78 mmHg  Pulse 77  Temp(Src)  97.3 F (36.3 C)  Ht  (1.702 m)  Wt 324 lb (146.965 kg)  BMI 50.73 kg/m2  SpO2 99%  Wt Readings from Last 3 Encounters:  10/14/14 324 lb (146.965 kg)  04/01/14 330 lb (149.687 kg)    Physical Exam  Constitutional: He appears well-developed and well-nourished. No distress.  HENT:  Head: Normocephalic and atraumatic.  Eyes: EOM are normal. No scleral icterus.  Neck: No thyromegaly present.  Cardiovascular: Normal rate and regular rhythm.   Pulmonary/Chest: Effort normal and breath sounds normal.  Abdominal: Soft. Bowel sounds are normal. He exhibits no distension.  Musculoskeletal: He exhibits no edema.  Neurological: Coordination normal.  Skin: Skin is warm and dry. No pallor.  Psychiatric: He has a normal mood and affect. His behavior is normal. Judgment and thought content normal.       Assessment & Plan:   Problem List Items Addressed This Visit      Cardiovascular and Mediastinum   PE (pulmonary embolism)    Continue blood thinner; last labs from [facility] checked      Relevant Medications   amLODipine (NORVASC) 5 MG tablet   rivaroxaban (XARELTO) 20 MG TABS tablet   lisinopril (PRINIVIL,ZESTRIL) 20 MG tablet   Hypertension    Labs from [facility] done July 2016 for  creatinine and potassium; continue prescriptions; weight loss would help      Relevant Medications   amLODipine (NORVASC) 5 MG tablet   rivaroxaban (XARELTO) 20 MG TABS tablet   lisinopril (PRINIVIL,ZESTRIL) 20 MG tablet     Digestive   GERD (gastroesophageal reflux disease)    Continue PPI; with his Xarelto, I will definitely err on the side of preventing gastritis, ulcer and continue the PPI; weight loss would likely help decrease reflux      Relevant Medications   omeprazole (PRILOSEC) 40 MG capsule     Other   Morbid obesity - Primary    He knows what he needs to do he says; declined referral to nutritionist; limit empty calories; hand-out given from [email or web address]      Chronic pain    Using  TENS unit; no narcotics from this office      Relevant Medications   escitalopram (LEXAPRO) 10 MG tablet       Follow up plan: No Follow-up on file.

## 2014-10-14 NOTE — Assessment & Plan Note (Signed)
Continue blood thinner; last labs from [facility] checked

## 2014-10-14 NOTE — Assessment & Plan Note (Signed)
Labs from [facility] done July 2016 for creatinine and potassium; continue prescriptions; weight loss would help

## 2014-10-14 NOTE — Assessment & Plan Note (Signed)
Continue PPI; with his Xarelto, I will definitely err on the side of preventing gastritis, ulcer and continue the PPI; weight loss would likely help decrease reflux

## 2014-12-01 ENCOUNTER — Telehealth: Payer: Self-pay

## 2014-12-01 NOTE — Telephone Encounter (Signed)
Patient left message stating he needs Dr. Sherie Don to write him a paper for his disability.

## 2014-12-01 NOTE — Telephone Encounter (Signed)
Left message to call to get additional information.

## 2014-12-02 NOTE — Telephone Encounter (Signed)
He just needs a generic letter stating his problems, that he must stop and rest often, that he's on blood thinners and must be careful, and limitations. He states it does not need to be to anyone's attention, it just needs to be a generic letter.

## 2014-12-07 ENCOUNTER — Encounter: Payer: Self-pay | Admitting: Family Medicine

## 2014-12-07 NOTE — Telephone Encounter (Signed)
That's fine; I'll type that up

## 2015-04-15 ENCOUNTER — Ambulatory Visit: Payer: Self-pay | Admitting: Family Medicine

## 2015-04-25 ENCOUNTER — Other Ambulatory Visit: Payer: Self-pay

## 2015-04-25 MED ORDER — RIVAROXABAN 20 MG PO TABS: 20.0000 mg | ORAL_TABLET | Freq: Every day | ORAL | Status: DC

## 2015-04-25 NOTE — Telephone Encounter (Signed)
Patient left message on my voicemail that he needed a refill on his Xarelto and that he was out. (Patient had left message on 04/11/14 at 9:20am, I just returned to the office today and had this message on my voicemail)

## 2015-04-27 ENCOUNTER — Encounter: Payer: Self-pay | Admitting: Family Medicine

## 2015-04-27 ENCOUNTER — Ambulatory Visit: Payer: Self-pay | Admitting: Family Medicine

## 2015-05-02 ENCOUNTER — Ambulatory Visit (INDEPENDENT_AMBULATORY_CARE_PROVIDER_SITE_OTHER): Payer: Self-pay | Admitting: Family Medicine

## 2015-05-02 ENCOUNTER — Encounter: Payer: Self-pay | Admitting: Family Medicine

## 2015-05-02 VITALS — BP 146/80 | HR 81 | Temp 97.4°F | Ht 67.0 in | Wt 330.0 lb

## 2015-05-02 DIAGNOSIS — G4733 Obstructive sleep apnea (adult) (pediatric): Secondary | ICD-10-CM

## 2015-05-02 DIAGNOSIS — R5382 Chronic fatigue, unspecified: Secondary | ICD-10-CM

## 2015-05-02 DIAGNOSIS — R5383 Other fatigue: Secondary | ICD-10-CM | POA: Insufficient documentation

## 2015-05-02 DIAGNOSIS — I2699 Other pulmonary embolism without acute cor pulmonale: Secondary | ICD-10-CM

## 2015-05-02 DIAGNOSIS — R252 Cramp and spasm: Secondary | ICD-10-CM

## 2015-05-02 DIAGNOSIS — I10 Essential (primary) hypertension: Secondary | ICD-10-CM

## 2015-05-02 NOTE — Progress Notes (Signed)
BP 146/80 mmHg  Pulse 81  Temp(Src) 97.4 F (36.3 C)  Ht  (1.702 m)  Wt 330 lb (149.687 kg)  BMI 51.67 kg/m2  SpO2 99%   Subjective:    Patient ID: Jack Sullivan, male    DOB: Oct 15, 1975, 40 y.o.   MRN: [id number]  HPI: Jack Sullivan is a 40 y.o. male  Chief Complaint  Patient presents with  . Hypertension    follow up and labs  . Morbid Obesity    follow up   He is here for 6 month f/u; no medical excitement since last visit (no ER trips or major health changes)  High blood pressure;noted today, but he says it is usually 120/78, or 120/82; cut down on country ham and sausage; baking more foods  Obesity; he has cut out the sodas; no soft drinks for 3 months; he does not feel like exercising; for the last few months, he has been feeling more out of breath; uses a walking stick; he has really cut back on the ice cream; salads for supper twice a week; weight has plateaued; weight today with stuff in his pockets   His hands feel like they cramp up; they hurt so bad now; squeezing hard hurts  Feet are okay; no twitching of eyelids Can't do repetitive motions, like typing on keyboard; just holding on to a cup is bothersome  He is taking the Xarelto, $4 a month through [facility] assistance program; I offered scan of his chest in case blood clot has returned since has some shortness of breath, but he declined; he says he is not missing any medicine; hx of DVT  Leg pain no real change  Lipids have not been a problem; last lipids from May 2015:  Total 145, TG 74, HDL 51, LDL 79   Relevant past medical, surgical, family and social history reviewed and updated as indicated Past Medical History  Diagnosis Date  . PE (pulmonary embolism) Nov 2013    bilateral following R leg injury  . 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 Morbid obesity (HCC)   . GERD (gastroesophageal reflux disease)   . Hypertension   . Peripheral neuropathy (HCC)   . Chronic pain   .  Depression   . OSA (obstructive sleep apnea)     sleep study done 05/2012   Past Surgical History  Procedure Laterality Date  . Hernia repair    . Cardiovascular stress test  11/2012    normal   Family History  Problem Relation Age of Onset  . Heart disease Father   . Hypertension Father   . Diabetes Paternal Grandfather   . Heart disease Paternal Grandfather   . Hypertension Paternal Grandfather   . Cancer Neg Hx   . COPD Neg Hx   . Stroke Neg Hx   His grandfather had "locked up hands", but he was a boxer No MEN-2  Interim medical history since our last visit reviewed. Allergies and medications reviewed and updated.  Review of Systems  Constitutional: Positive for fatigue.  HENT: Negative for nosebleeds.   Gastrointestinal: Negative for blood in stool.  Genitourinary: Negative for hematuria.  Neurological: Positive for tremors (nothing new; worse with coffee and fatigue).  Hematological: Bruises/bleeds easily (minor bleeding from cuts, bruises easily).  Per HPI unless specifically indicated above     Objective:    BP 146/80 mmHg  Pulse 81  Temp(Src) 97.4 F (36.3 C)  Ht  (  1.702 m)  Wt 330 lb (149.687 kg)  BMI 51.67 kg/m2  SpO2 99%  Wt Readings from Last 3 Encounters:  05/02/15 330 lb (149.687 kg)  10/14/14 324 lb (146.965 kg)  04/01/14 330 lb (149.687 kg)    Physical Exam  Constitutional: He appears well-developed and well-nourished. No distress.  Morbidly obese, BMI >50  HENT:  Head: Normocephalic and atraumatic.  Eyes: EOM are normal. No scleral icterus.  Neck: No thyromegaly present.  Cardiovascular: Normal rate and regular rhythm.  Exam reveals no gallop.   No murmur heard. Pulmonary/Chest: Effort normal and breath sounds normal. No respiratory distress. He has no wheezes.  Abdominal: Soft. Bowel sounds are normal. He exhibits no distension.  Musculoskeletal: He exhibits no edema.  Neurological: He is alert. Coordination normal.  Skin: Skin is  warm and dry. No bruising and no ecchymosis noted. No pallor.  Psychiatric: He has a normal mood and affect. His behavior is normal. Judgment and thought content normal.  Good eye contact with examiner; very pleasant      Assessment & Plan:   Problem List Items Addressed This Visit      Cardiovascular and Mediastinum   PE (pulmonary embolism)    Bilateral PE after right leg injury; continue Xarelto; he denies missing doses; he has had more shortness of breath lately; could be deconditioning, obesity, heart failure; he declined CT chest and echo; I am here to help if he wants me to order scans; to ER if worse      Hypertension - Primary    Will help him work on weight loss        Respiratory   OSA (obstructive sleep apnea)    Not wearing mask; can't get it covered; I encouraged him to ask [facility] if they can help him with it; I told him that treating with CPAP would likely help his symptoms and may help with weight loss, give him more energy        Other   Morbid obesity (HCC)    Talked with patient about weight; he might benefit from [place]; I don't think Belviq is a good option; another option would be Contrave      Relevant Medications   Liraglutide -Weight Management (SAXENDA) 18 MG/3ML SOPN   Fatigue    encouraged him to get back on CPAP; offered echo, but he says he cannot afford it right now; will get labs today; deconditioning likely plays a part      Relevant Orders   CBC with Differential/Platelet (Completed)   TSH (Completed)   Comprehensive metabolic panel (Completed)    Other Visit Diagnoses    Hand cramps        Relevant Orders    Magnesium (Completed)       Follow up plan: Return in about 6 weeks (around 06/13/2015), or (after starting Saxenda), for Saxenda follow-up; 6 month f/u for routine care.  Meds ordered this encounter  Medications  . Liraglutide -Weight Management (SAXENDA) 18 MG/3ML SOPN    Sig: Inject 0.6 mg into the skin daily. X 1 week, then  1.2 mg daily x 1 week, then 1.8 mg daily x 1 week, then 2.4 mg daily x 1 week, then 3 mg daily    Dispense:  3 mL    Refill:  0   An after-visit summary was printed and given to the patient at check-out.  Please see the patient instructions which may contain other information and recommendations beyond what is mentioned above in  the assessment and plan.

## 2015-05-02 NOTE — Assessment & Plan Note (Addendum)
Not wearing mask; can't get it covered; I encouraged him to ask [facility] if they can help him with it; I told him that treating with CPAP would likely help his symptoms and may help with weight loss, give him more energy

## 2015-05-02 NOTE — Assessment & Plan Note (Signed)
encouraged him to get back on CPAP; offered echo, but he says he cannot afford it right now; will get labs today; deconditioning likely plays a part

## 2015-05-02 NOTE — Patient Instructions (Addendum)
Let's consider Saxenda, and you can read about it Let me know if you'd like to see a nutritionist We'll contact you with the lab results Let me know if there is anything I can do to help with chest CT or CPAP or the echo I'll be moving to another practice, [facility], on May 30, 2015 I'll be here until May 27, 2015 You are welcome to either follow me there for ongoing care, or [facility] and see Dr. Olevia PerchesMegan Johnson or our nurse practitioner Gabriel Cirriheryl Wicker

## 2015-05-02 NOTE — Assessment & Plan Note (Signed)
Will help him work on weight loss

## 2015-05-02 NOTE — Assessment & Plan Note (Signed)
Talked with patient about weight; he might benefit from [place]; I don't think Belviq is a good option; another option would be Contrave

## 2015-05-02 NOTE — Assessment & Plan Note (Addendum)
Bilateral PE after right leg injury; continue Xarelto; he denies missing doses; he has had more shortness of breath lately; could be deconditioning, obesity, heart failure; he declined CT chest and echo; I am here to help if he wants me to order scans; to ER if worse

## 2015-05-03 ENCOUNTER — Telehealth: Payer: Self-pay | Admitting: Family Medicine

## 2015-05-03 LAB — COMPREHENSIVE METABOLIC PANEL
ALK PHOS: 99 IU/L (ref 39–117)
ALT: 33 IU/L (ref 0–44)
AST: 22 IU/L (ref 0–40)
Albumin/Globulin Ratio: 1.6 (ref 1.1–2.5)
Albumin: 4 g/dL (ref 3.5–5.5)
BUN/Creatinine Ratio: 22 — ABNORMAL HIGH (ref 9–20)
BUN: 21 mg/dL (ref 6–24)
Bilirubin Total: 0.4 mg/dL (ref 0.0–1.2)
CALCIUM: 9 mg/dL (ref 8.7–10.2)
CO2: 26 mmol/L (ref 18–29)
Chloride: 97 mmol/L (ref 96–106)
Creatinine, Ser: 0.95 mg/dL (ref 0.76–1.27)
GFR calc Af Amer: 115 mL/min/{1.73_m2} (ref >59)
GFR, EST NON AFRICAN AMERICAN: 100 mL/min/{1.73_m2} (ref >59)
GLOBULIN, TOTAL: 2.5 g/dL (ref 1.5–4.5)
GLUCOSE: 88 mg/dL (ref 65–99)
Potassium: 4.5 mmol/L (ref 3.5–5.2)
Sodium: 140 mmol/L (ref 134–144)
Total Protein: 6.5 g/dL (ref 6.0–8.5)

## 2015-05-03 LAB — CBC WITH DIFFERENTIAL/PLATELET
BASOS: 0 %
Basophils Absolute: 0 10*3/uL (ref 0.0–0.2)
EOS (ABSOLUTE): 0.2 10*3/uL (ref 0.0–0.4)
EOS: 3 %
HEMATOCRIT: 42.2 % (ref 37.5–51.0)
Hemoglobin: 13.8 g/dL (ref 12.6–17.7)
IMMATURE GRANS (ABS): 0 10*3/uL (ref 0.0–0.1)
IMMATURE GRANULOCYTES: 0 %
LYMPHS: 29 %
Lymphocytes Absolute: 2.1 10*3/uL (ref 0.7–3.1)
MCH: 28.3 pg (ref 26.6–33.0)
MCHC: 32.7 g/dL (ref 31.5–35.7)
MCV: 87 fL (ref 79–97)
MONOS ABS: 0.6 10*3/uL (ref 0.1–0.9)
Monocytes: 8 %
NEUTROS PCT: 60 %
Neutrophils Absolute: 4.3 10*3/uL (ref 1.4–7.0)
PLATELETS: 242 10*3/uL (ref 150–379)
RBC: 4.88 x10E6/uL (ref 4.14–5.80)
RDW: 14.4 % (ref 12.3–15.4)
WBC: 7.3 10*3/uL (ref 3.4–10.8)

## 2015-05-03 LAB — MAGNESIUM: MAGNESIUM: 1.9 mg/dL (ref 1.6–2.3)

## 2015-05-03 LAB — TSH: TSH: 1.1 u[IU]/mL (ref 0.450–4.500)

## 2015-05-03 NOTE — Telephone Encounter (Signed)
Pt was returning call for Amy.

## 2015-05-04 NOTE — Telephone Encounter (Signed)
Spoke with patient.

## 2015-05-13 ENCOUNTER — Encounter: Payer: Self-pay | Admitting: Family Medicine

## 2015-05-13 MED ORDER — LIRAGLUTIDE -WEIGHT MANAGEMENT 18 MG/3ML ~~LOC~~ SOPN: 0.6000 mg | PEN_INJECTOR | Freq: Every day | SUBCUTANEOUS | Status: DC

## 2015-07-06 ENCOUNTER — Other Ambulatory Visit: Payer: Self-pay | Admitting: Family Medicine

## 2015-07-06 MED ORDER — OMEPRAZOLE 40 MG PO CPDR: 40.0000 mg | DELAYED_RELEASE_CAPSULE | Freq: Every day | ORAL | Status: DC

## 2015-07-06 MED ORDER — AMLODIPINE BESYLATE 5 MG PO TABS: 5.0000 mg | ORAL_TABLET | Freq: Every day | ORAL | Status: DC

## 2015-07-06 MED ORDER — LISINOPRIL 20 MG PO TABS: 20.0000 mg | ORAL_TABLET | Freq: Every day | ORAL | Status: DC

## 2015-07-06 MED ORDER — RIVAROXABAN 20 MG PO TABS: 20.0000 mg | ORAL_TABLET | Freq: Every day | ORAL | Status: DC

## 2015-07-06 NOTE — Telephone Encounter (Signed)
Labs march 2017 reviewed; Rxs approved

## 2015-07-06 NOTE — Telephone Encounter (Signed)
Patient scheduled appointment for 07-15-15 but is needing a refill on Amlodipine, Xarelto, Lisinopril, and Ameprazole. Please send to [facility]. He will be completely out by appointment date.

## 2015-07-13 ENCOUNTER — Other Ambulatory Visit: Payer: Self-pay

## 2015-07-13 MED ORDER — ESCITALOPRAM OXALATE 10 MG PO TABS: 10.0000 mg | ORAL_TABLET | Freq: Every day | ORAL | Status: DC

## 2015-07-13 NOTE — Telephone Encounter (Signed)
Routing to provider, he has an appointment with you on Friday.

## 2015-07-15 ENCOUNTER — Ambulatory Visit (INDEPENDENT_AMBULATORY_CARE_PROVIDER_SITE_OTHER): Payer: Self-pay | Admitting: Family Medicine

## 2015-07-15 ENCOUNTER — Encounter: Payer: Self-pay | Admitting: Family Medicine

## 2015-07-15 VITALS — BP 122/78 | HR 85 | Temp 98.0°F | Resp 20 | Ht 67.0 in | Wt 335.4 lb

## 2015-07-15 DIAGNOSIS — R319 Hematuria, unspecified: Secondary | ICD-10-CM

## 2015-07-15 DIAGNOSIS — Z86711 Personal history of pulmonary embolism: Secondary | ICD-10-CM

## 2015-07-15 DIAGNOSIS — I10 Essential (primary) hypertension: Secondary | ICD-10-CM

## 2015-07-15 DIAGNOSIS — F321 Major depressive disorder, single episode, moderate: Secondary | ICD-10-CM

## 2015-07-15 DIAGNOSIS — T24201A Burn of second degree of unspecified site of right lower limb, except ankle and foot, initial encounter: Secondary | ICD-10-CM

## 2015-07-15 DIAGNOSIS — G4700 Insomnia, unspecified: Secondary | ICD-10-CM

## 2015-07-15 DIAGNOSIS — G8929 Other chronic pain: Secondary | ICD-10-CM

## 2015-07-15 LAB — POCT URINALYSIS DIPSTICK
Bilirubin, UA: NEGATIVE
Glucose, UA: NEGATIVE
Ketones, UA: NEGATIVE
LEUKOCYTES UA: NEGATIVE
NITRITE UA: NEGATIVE
PH UA: 5.5
PROTEIN UA: NEGATIVE
Spec Grav, UA: >=1.03
UROBILINOGEN UA: 0.2

## 2015-07-15 MED ORDER — TRAZODONE HCL 50 MG PO TABS: 50.0000 mg | ORAL_TABLET | Freq: Every evening | ORAL | Status: DC | PRN

## 2015-07-15 MED ORDER — DULOXETINE HCL 60 MG PO CPEP: 60.0000 mg | ORAL_CAPSULE | Freq: Every day | ORAL | Status: DC

## 2015-07-15 MED ORDER — DULOXETINE HCL 30 MG PO CPEP: ORAL_CAPSULE | ORAL | Status: DC

## 2015-07-15 NOTE — Patient Instructions (Addendum)
Please do start the new medicine today, Cymbalta 30 mg daily, take once a day for 10 days and then go up to the 60 mg strength Use the trazodone for sleep if needed Consider Alli for weight loss (over-the-counter) Check out the information at [email or web address].org entitled "Nutrition for Weight Loss: What You Need to Know about Fad Diets" Try to lose between 1-2 pounds per week by taking in fewer calories and burning off more calories You can succeed by limiting portions, limiting foods dense in calories and fat, becoming more active, and drinking 8 glasses of water a day (64 ounces) Don't skip meals, especially breakfast, as skipping meals may alter your metabolism Do not use over-the-counter weight loss pills or gimmicks that claim rapid weight loss A healthy BMI (or body mass index) is between 18.5 and 24.9 You can calculate your ideal BMI at the NIH website [employer]huhttp://www.nhlbi.nih.gov/health/educational/lose_wt/BMI/bmicalc.htm Call me with any problems before your next appointment Seek immediate medical attention for any dark thoughts
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
## 2015-07-17 ENCOUNTER — Encounter: Payer: Self-pay | Admitting: Family Medicine

## 2015-07-17 DIAGNOSIS — G4700 Insomnia, unspecified: Secondary | ICD-10-CM | POA: Insufficient documentation

## 2015-07-17 NOTE — Assessment & Plan Note (Signed)
Rx for trazodone, hope to use this short-term to help with sleep associated with recent depression

## 2015-07-17 NOTE — Assessment & Plan Note (Signed)
Will have patient try OTC Alli; see AVS for additional recommendations

## 2015-07-17 NOTE — Assessment & Plan Note (Signed)
The SNRI may help with depression as well as his pain

## 2015-07-17 NOTE — Assessment & Plan Note (Signed)
Hx of bilateral PEs following leg injury; will request lifelong anticoagulation

## 2015-07-17 NOTE — Assessment & Plan Note (Signed)
Reviewed the refills requests with him, and explained that I refilled the SSRI when it came to me; apologized on behalf of my staff if he requested the SSRI on the 10th but it was not sent to me; regardless, will have him start SNRI intead, and see if that will give him some more energy, less fatigue, and help his mood; supportive listening provided; he denies SI/HI; close f/u

## 2015-07-17 NOTE — Assessment & Plan Note (Signed)
Controlled today; obviously, significant weight loss would help

## 2015-07-20 IMAGING — US US EXTREM LOW VENOUS*R*
1 series · 14 of 23 positions shown · non-contrast
Comparison: none

REASON FOR EXAM: Eval for DVT  leg pain   swelling
COMMENTS:

[Series 1: us extrem low venous*right* · 0.13mm/px · 14 of 23 slices shown]
[im 1/23]
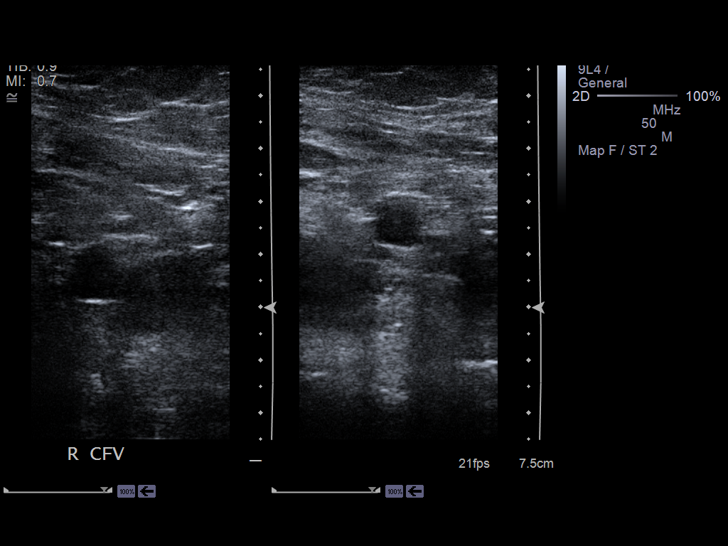
[im 3/23]
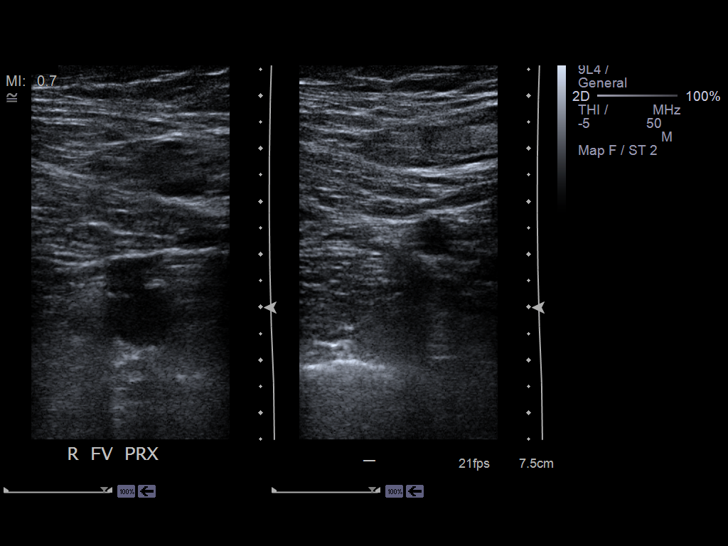
[im 5/23]
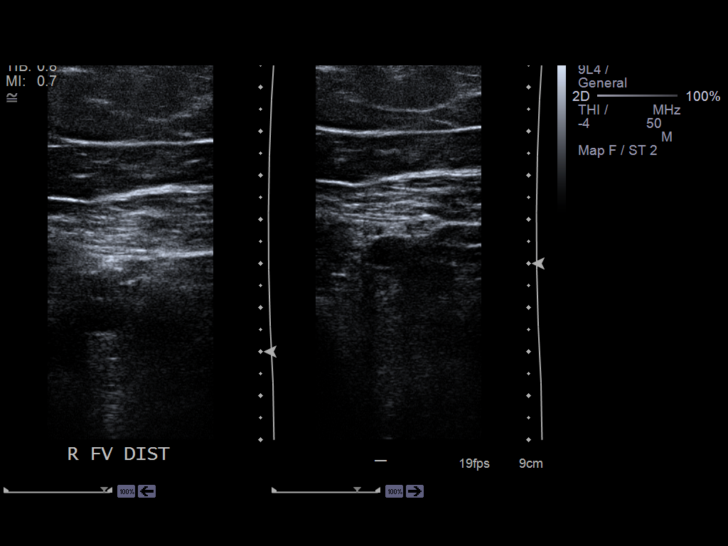
[im 6/23]
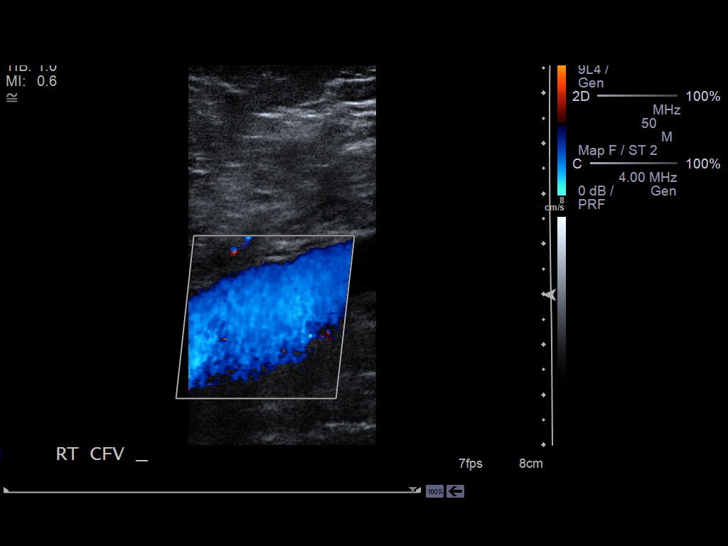
[im 8/23]
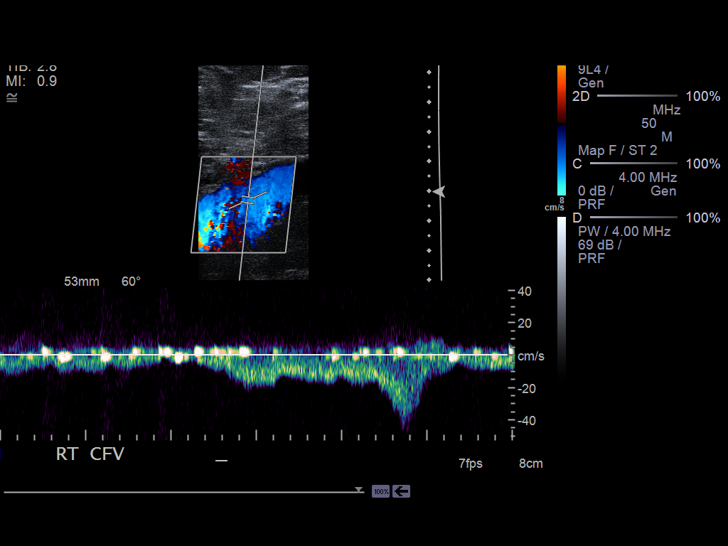
[im 10/23]
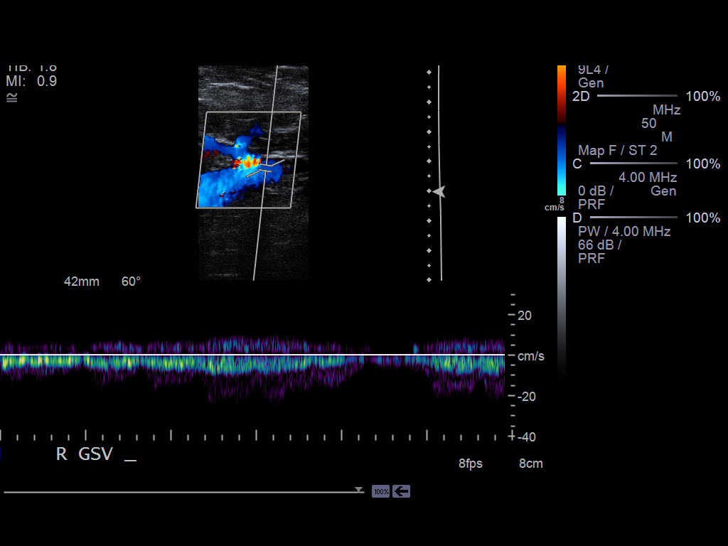
[im 11/23]
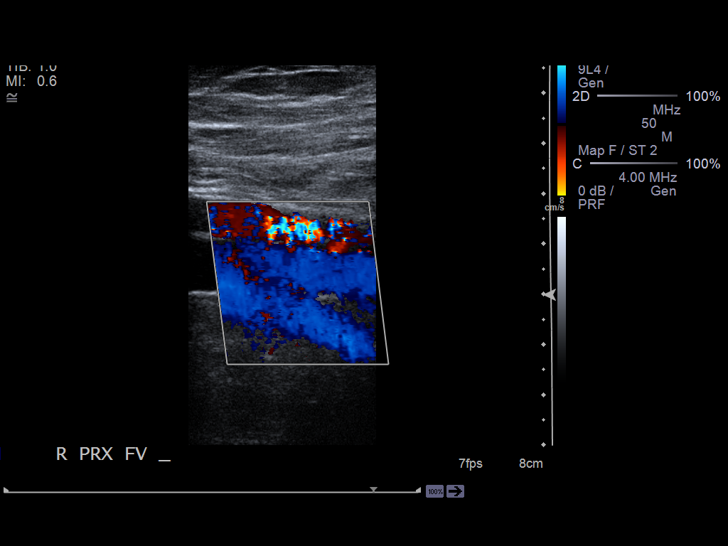
[im 13/23]
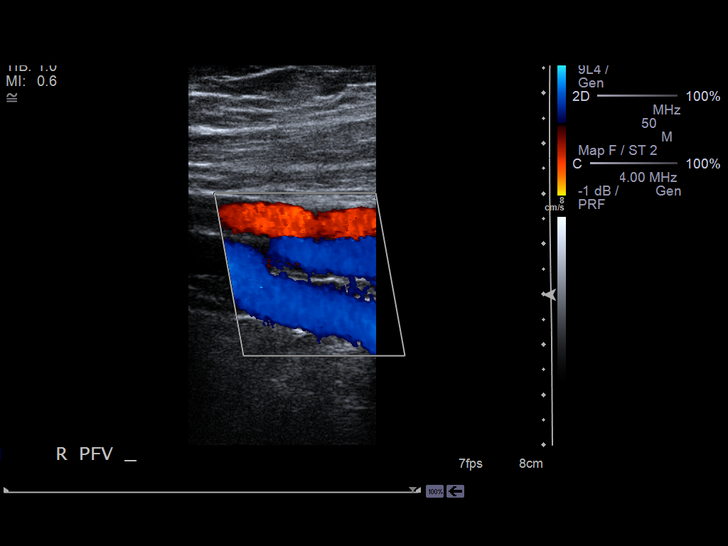
[im 14/23]
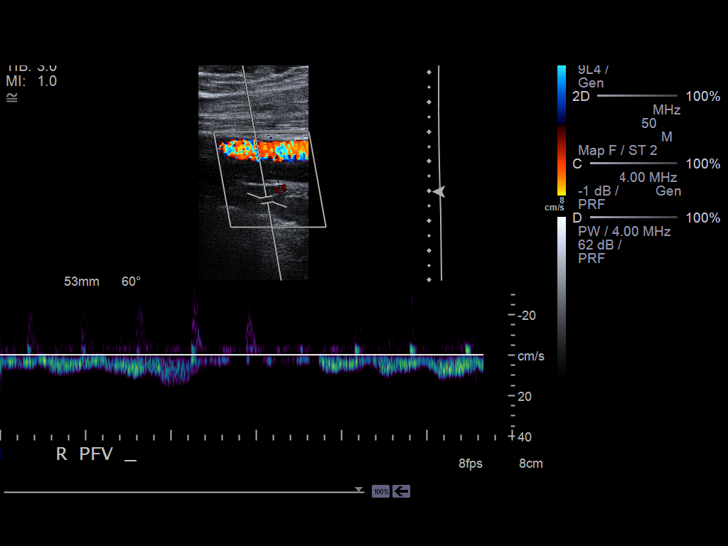
[im 16/23]
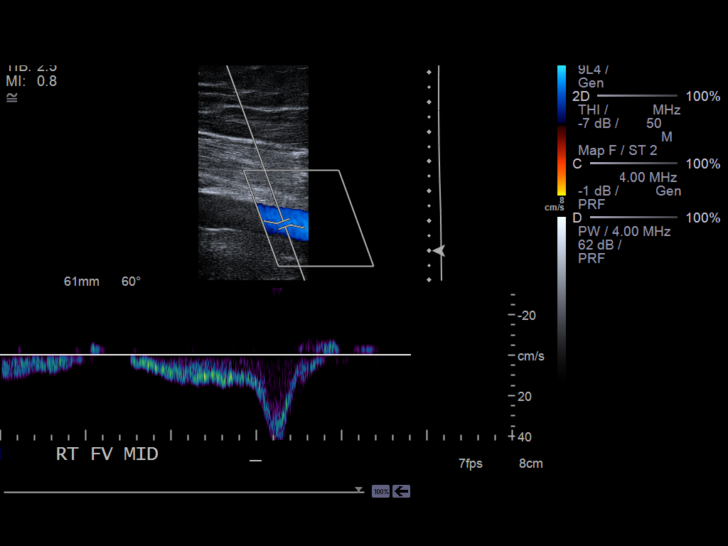
[im 18/23]
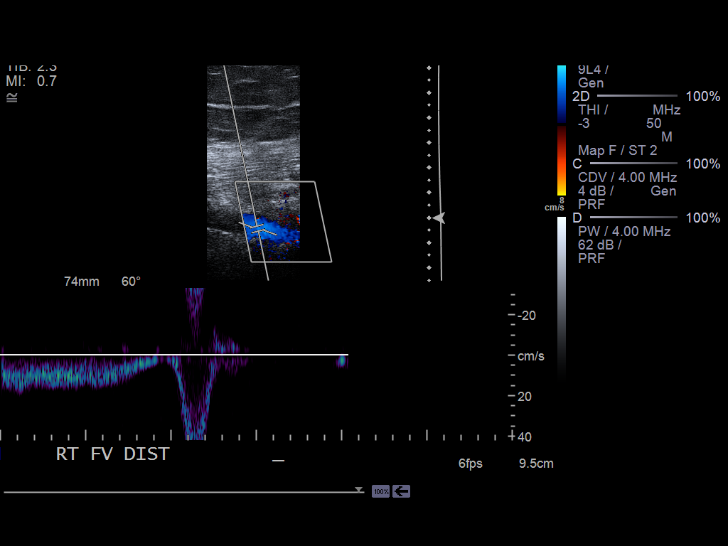
[im 19/23]
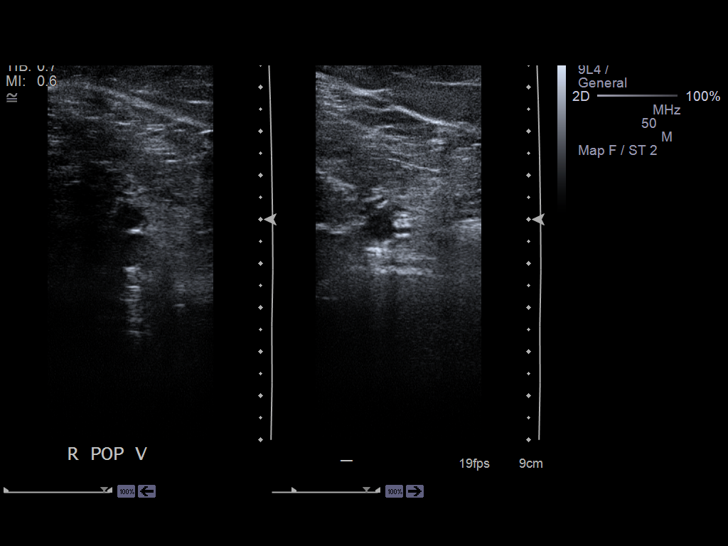
[im 21/23]
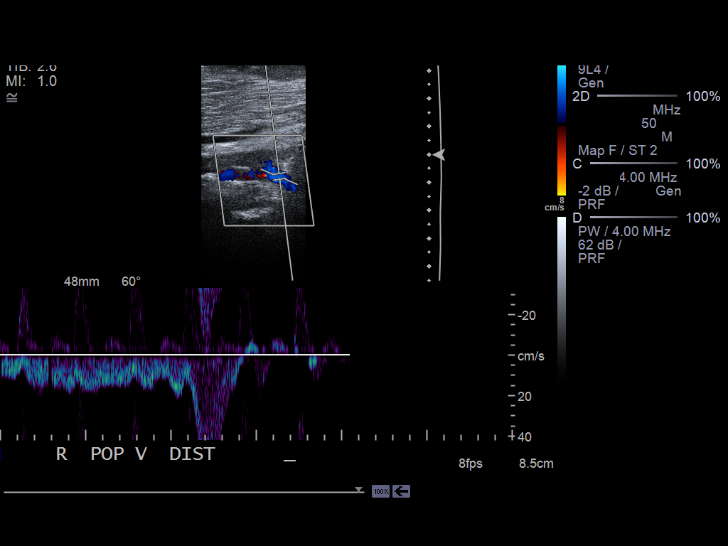
[im 23/23]
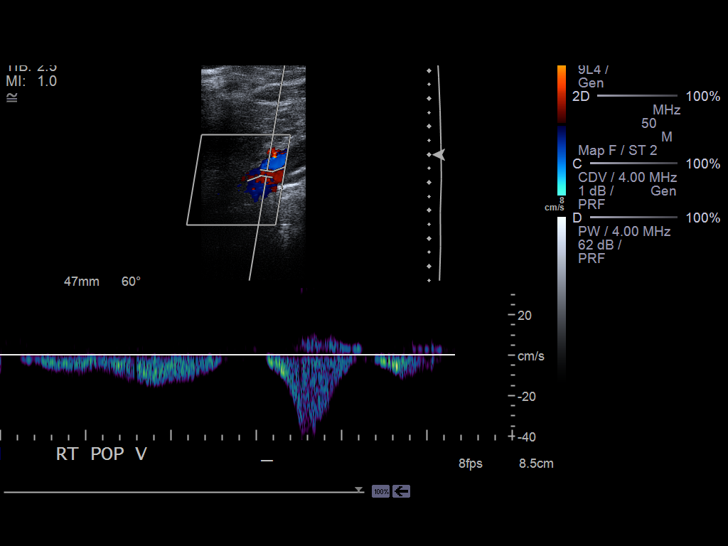

[14 of 23 positions shown; findings below may reference images not displayed]

PROCEDURE:     AUJLA - AUJLA DOPPLER VEINS RT LEG EXTR  - September 30, 2012  [DATE]

RESULT:     Doppler interrogation of the deep venous system of the right leg
is performed from the common femoral vein through the popliteal vein.
Grayscale compression images show complete compressibility of the deep
venous structures. Color Doppler and spectral Doppler appearance is normal
in the area imaged. There is no evidence of abnormal fluid collection.
IMPRESSION: 1. No evidence of right lower extremity deep vein thrombosis.

[REDACTED]

## 2015-08-05 ENCOUNTER — Ambulatory Visit: Payer: Self-pay | Admitting: Family Medicine

## 2015-08-12 ENCOUNTER — Ambulatory Visit (INDEPENDENT_AMBULATORY_CARE_PROVIDER_SITE_OTHER): Payer: Self-pay | Admitting: Family Medicine

## 2015-08-12 ENCOUNTER — Encounter: Payer: Self-pay | Admitting: Family Medicine

## 2015-08-12 VITALS — BP 124/82 | HR 99 | Temp 98.6°F | Resp 14 | Wt 335.0 lb

## 2015-08-12 DIAGNOSIS — G4700 Insomnia, unspecified: Secondary | ICD-10-CM

## 2015-08-12 DIAGNOSIS — G4733 Obstructive sleep apnea (adult) (pediatric): Secondary | ICD-10-CM

## 2015-08-12 DIAGNOSIS — F321 Major depressive disorder, single episode, moderate: Secondary | ICD-10-CM

## 2015-08-12 MED ORDER — DULOXETINE HCL 60 MG PO CPEP: 60.0000 mg | ORAL_CAPSULE | Freq: Every day | ORAL | Status: DC

## 2015-08-12 MED ORDER — TRAZODONE HCL 50 MG PO TABS: 50.0000 mg | ORAL_TABLET | Freq: Every evening | ORAL | Status: AC | PRN

## 2015-08-12 NOTE — Patient Instructions (Addendum)
I strongly recommend counseling Start sublingual vitamin B12 250 or 500 or 1000 mcg daily Start vitamin D3 and take 1000 iu daily Continue the cymbalta (duloxetine) Start trazodone at night to help you sleep Call me after 2 weeks with an update and let me know how both meds are working and we can adjust if needed by phone If you get tons of energy and talk a mile a minute and shoot your mood over the moon, then call me right away Do start to walk or get some outdoor exercise, time outside

## 2015-08-12 NOTE — Progress Notes (Signed)
BP 124/82 mmHg  Pulse 99  Temp(Src) 98.6 F (37 C) (Oral)  Resp 14  Wt 335 lb (151.955 kg)  SpO2 95%   Subjective:    Patient ID: Jack Sullivan, male    DOB: 02/04/1976, 40 y.o.   MRN: [id number]  HPI: TELESFORO BROSNAHAN is a 40 y.o. male  Chief Complaint  Patient presents with  . Follow-up    3 weeks   Patient is here for f/u after starting on cymbalta for depression; he feels some better than before, but not back to baseline; not back to where he was; not picking and cutting up around people; not the jolly self he used to be; no thoughts of self-harm or harming others; lots of issues with mother, cancer; she has radiation and chemo; spread to her bones now; not seeing counselor  Never did start the trazodone; he says they didn't say anything to his wife about when she picked up the cymbalta; I personally called the pharmacy and it has been there on hold; they did get my original prescription; I sent more  He is not sleeping much; feels like he could just lay down to go to sleep, but then can't fall asleep and has trouble falling asleep  He declined psychiatrist referral when offered today  Has OSA; using CPAP; total hours of sleep lately 1.5 hours; no mania or hypomania, just poor sleep quality and up and down all night; was used to get up from leg issues  Depression screen [facility] 2/9 08/12/2015 08/12/2015 07/15/2015  Decreased Interest 1 0 3  Down, Depressed, Hopeless 2 1 3   PHQ - 2 Score 3 1 6   Altered sleeping 3 - 3  Tired, decreased energy 3 - 3  Change in appetite 2 - 3  Feeling bad or failure about yourself  2 - 2  Trouble concentrating 2 - 3  Moving slowly or fidgety/restless 2 - 2  Suicidal thoughts 0 - 0  PHQ-9 Score 17 - 22  Difficult doing work/chores Somewhat difficult - -   GAD 7 : Generalized Anxiety Score 07/15/2015  Nervous, Anxious, on Edge 3  Control/stop worrying 1  Worry too much - different things 2  Trouble relaxing 3  Restless 1  Easily annoyed or  irritable 3  Afraid - awful might happen 3  Total GAD 7 Score 16   Relevant past medical, surgical, family and social history reviewed Past Medical History  Diagnosis Date  . PE (pulmonary embolism) Nov 2013    bilateral following R leg injury  . 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 Morbid obesity (HCC)   . GERD (gastroesophageal reflux disease)   . Hypertension   . Peripheral neuropathy (HCC)   . Chronic pain   . Depression   . OSA (obstructive sleep apnea)     sleep study done 05/2012  . Personal history of pulmonary embolism 12/28/2011    bilateral following R leg injury    Family History  Problem Relation Age of Onset  . Heart disease Father   . Hypertension Father   . Diabetes Paternal Grandfather   . Heart disease Paternal Grandfather   . Hypertension Paternal Grandfather   . Cancer Neg Hx   . COPD Neg Hx   . Stroke Neg Hx   MD Notes: mother has cancer  Social History  Substance Use Topics  . Smoking status: Former Smoker -- 1.00 packs/day for 20 years    Types:  Cigarettes    Quit date: 02/27/2008  . Smokeless tobacco: Never Used  . Alcohol Use: No   Interim medical history since last visit reviewed. Allergies and medications reviewed  Review of Systems Per HPI unless specifically indicated above     Objective:    BP 124/82 mmHg  Pulse 99  Temp(Src) 98.6 F (37 C) (Oral)  Resp 14  Wt 335 lb (151.955 kg)  SpO2 95%  Wt Readings from Last 3 Encounters:  08/12/15 335 lb (151.955 kg)  07/15/15 335 lb 7 oz (152.153 kg)  05/02/15 330 lb (149.687 kg)    Physical Exam  Constitutional: He appears well-developed and well-nourished. No distress.  Eyes: No scleral icterus.  Cardiovascular: Normal rate and regular rhythm.   Pulmonary/Chest: Effort normal and breath sounds normal.  Psychiatric: His behavior is normal. Judgment and thought content normal. His mood appears not anxious. His speech is not delayed. He is not slowed and  not withdrawn. Cognition and memory are not impaired. He does not express impulsivity. He exhibits a depressed mood (mildly). He expresses no homicidal and no suicidal ideation.  Good eye contact with examiner; not despondent; not tearful He is attentive.   Results for orders placed or performed in visit on 07/15/15  POCT urinalysis dipstick  Result Value Ref Range   Color, UA yell    Clarity, UA clear    Glucose, UA neg    Bilirubin, UA neg    Ketones, UA neg    Spec Grav, UA >=1.030    Blood, UA trace    pH, UA 5.5    Protein, UA neg    Urobilinogen, UA 0.2    Nitrite, UA neg    Leukocytes, UA Negative Negative      Assessment & Plan:   Problem List Items Addressed This Visit      Respiratory   OSA (obstructive sleep apnea) - Primary    Continue CPAP        Other   Insomnia    Patient will pick up the trazodone at the pharmacy; if helpful, then continue; try 50 mg first, then 100 mg; we can go up to 150 mg at night if needed      Moderate major depression, single episode (HCC)    With stressor of mother's cancer; recommended counseling; patient declines referral to psych; continue cymbalta; return to see me in 4 weeks if not improving; call sooner if hypomania or mania; he agrees      Relevant Medications   DULoxetine (CYMBALTA) 60 MG capsule   traZODone (DESYREL) 50 MG tablet   Morbid obesity (HCC)    Not successful with weight loss         Follow up plan: Return in about 4 weeks (around 09/09/2015) for follow-up.  (he does not have insurance; if doing well and not needing appt, okay to just call or f/u later when needed)  An after-visit summary was printed and given to the patient at check-out.  Please see the patient instructions which may contain other information and recommendations beyond what is mentioned above in the assessment and plan.  Meds ordered this encounter  Medications  . DULoxetine (CYMBALTA) 60 MG capsule    Sig: Take 1 capsule (60 mg  total) by mouth daily.    Dispense:  30 capsule    Refill:  5  . traZODone (DESYREL) 50 MG tablet    Sig: Take 1-2 tablets (50-100 mg total) by mouth at bedtime as needed for  sleep.    Dispense:  60 tablet    Refill:  3

## 2015-08-13 NOTE — Assessment & Plan Note (Signed)
With stressor of mother's cancer; recommended counseling; patient declines referral to psych; continue cymbalta; return to see me in 4 weeks if not improving; call sooner if hypomania or mania; he agrees

## 2015-08-13 NOTE — Assessment & Plan Note (Signed)
Not successful with weight loss

## 2015-08-13 NOTE — Assessment & Plan Note (Signed)
Patient will pick up the trazodone at the pharmacy; if helpful, then continue; try 50 mg first, then 100 mg; we can go up to 150 mg at night if needed

## 2015-08-13 NOTE — Assessment & Plan Note (Signed)
Continue CPAP.  

## 2015-08-17 ENCOUNTER — Telehealth: Payer: Self-pay | Admitting: Family Medicine

## 2015-08-17 NOTE — Telephone Encounter (Signed)
Called pt waiting on return call.  [facility] is [facility] and they will not give him cymbalta refill due to it being a tier 3.  He can pay out of pocket but would be very exspensive.  The alernative to that medication is [employer]ine

## 2015-08-17 NOTE — Telephone Encounter (Signed)
PT SAID THAT WHEN HE WENT TO [place] TO PICK UP THE MEDICATION CYMBALTA THE PHARMACY AT( [facility]) THEY WANTED TO KNOW WHY THE DR IS GIVING THIS MEDICATION TO HIM. HE IS ON PATIENT ASSISTANCE PROGRAM  WITH [facility]. COULD YOU CALL THEM AT [phone] AND SEE WHY THEY WOULD NOT FILL THIS MEDICATION.  ALSO HE ONLY HAS ONE PILL TO TAKE FOR TOMORROW

## 2015-08-18 MED ORDER — VENLAFAXINE HCL ER 150 MG PO CP24: 150.0000 mg | ORAL_CAPSULE | Freq: Every day | ORAL | Status: AC

## 2015-08-18 NOTE — Telephone Encounter (Signed)
Pt.notified

## 2015-08-18 NOTE — Telephone Encounter (Signed)
Okay, we'll do the best we can with what we have to work with He can finish the last pill and then then NEXT day, start the new medicine (I sent Rx) It's pretty close to apples and apples, but he may notice a few symptoms over the coming week with the change (skin sensations, crawling) but he may be completely fine; don't be alarmed if he notices those If any major problems with mood, then call or seek medical help right away

## 2015-08-18 NOTE — Telephone Encounter (Signed)
Pt willing to try something different the alternative is venafalxine?

## 2015-09-11 ENCOUNTER — Emergency Department: Payer: Self-pay

## 2015-09-11 ENCOUNTER — Emergency Department
Admission: EM | Admit: 2015-09-11 | Discharge: 2015-09-11 | Disposition: A | Discharge status: Home / Self Care | Payer: Self-pay | Attending: Emergency Medicine | Admitting: Emergency Medicine

## 2015-09-11 DIAGNOSIS — F329 Major depressive disorder, single episode, unspecified: Secondary | ICD-10-CM | POA: Insufficient documentation

## 2015-09-11 DIAGNOSIS — Z79899 Other long term (current) drug therapy: Secondary | ICD-10-CM | POA: Insufficient documentation

## 2015-09-11 DIAGNOSIS — I10 Essential (primary) hypertension: Secondary | ICD-10-CM | POA: Insufficient documentation

## 2015-09-11 DIAGNOSIS — R079 Chest pain, unspecified: Secondary | ICD-10-CM

## 2015-09-11 DIAGNOSIS — Z86711 Personal history of pulmonary embolism: Secondary | ICD-10-CM | POA: Insufficient documentation

## 2015-09-11 DIAGNOSIS — R0789 Other chest pain: Secondary | ICD-10-CM | POA: Insufficient documentation

## 2015-09-11 DIAGNOSIS — Z87891 Personal history of nicotine dependence: Secondary | ICD-10-CM | POA: Insufficient documentation

## 2015-09-11 DIAGNOSIS — K8020 Calculus of gallbladder without cholecystitis without obstruction: Secondary | ICD-10-CM | POA: Insufficient documentation

## 2015-09-11 LAB — CBC WITH DIFFERENTIAL/PLATELET
Basophils Absolute: 0.1 10*3/uL (ref 0–0.1)
Basophils Relative: 1 %
EOS ABS: 0.5 10*3/uL (ref 0–0.7)
Eosinophils Relative: 5 %
HCT: 42.7 % (ref 40.0–52.0)
HEMOGLOBIN: 14.6 g/dL (ref 13.0–18.0)
LYMPHS ABS: 2.2 10*3/uL (ref 1.0–3.6)
LYMPHS PCT: 24 %
MCH: 29.2 pg (ref 26.0–34.0)
MCHC: 34.2 g/dL (ref 32.0–36.0)
MCV: 85.3 fL (ref 80.0–100.0)
Monocytes Absolute: 0.6 10*3/uL (ref 0.2–1.0)
Monocytes Relative: 7 %
NEUTROS PCT: 63 %
Neutro Abs: 5.8 10*3/uL (ref 1.4–6.5)
Platelets: 192 10*3/uL (ref 150–440)
RBC: 5.01 MIL/uL (ref 4.40–5.90)
RDW: 14 % (ref 11.5–14.5)
WBC: 9.2 10*3/uL (ref 3.8–10.6)

## 2015-09-11 LAB — COMPREHENSIVE METABOLIC PANEL
ALK PHOS: 95 U/L (ref 38–126)
ALT: 42 U/L (ref 17–63)
AST: 29 U/L (ref 15–41)
Albumin: 3.8 g/dL (ref 3.5–5.0)
Anion gap: 7 (ref 5–15)
BUN: 25 mg/dL — AB (ref 6–20)
CALCIUM: 9.3 mg/dL (ref 8.9–10.3)
CO2: 25 mmol/L (ref 22–32)
CREATININE: 1.2 mg/dL (ref 0.61–1.24)
Chloride: 107 mmol/L (ref 101–111)
GFR calc non Af Amer: >60 mL/min (ref >60)
GLUCOSE: 113 mg/dL — AB (ref 65–99)
Potassium: 4 mmol/L (ref 3.5–5.1)
SODIUM: 139 mmol/L (ref 135–145)
Total Bilirubin: 0.4 mg/dL (ref 0.3–1.2)
Total Protein: 7.2 g/dL (ref 6.5–8.1)

## 2015-09-11 LAB — LIPASE, BLOOD: Lipase: 35 U/L (ref 11–51)

## 2015-09-11 LAB — PROTIME-INR
INR: 1.86
Prothrombin Time: 21.4 seconds — ABNORMAL HIGH (ref 11.4–15.0)

## 2015-09-11 LAB — APTT: aPTT: 34 seconds (ref 24–36)

## 2015-09-11 LAB — TROPONIN I
Troponin I: <0.03 ng/mL (ref <0.03)
Troponin I: <0.03 ng/mL (ref <0.03)

## 2015-09-11 MED ORDER — ONDANSETRON HCL 4 MG/2ML IJ SOLN
4.0000 mg | Freq: Once | INTRAMUSCULAR | Status: AC
  Administered 2015-09-11: 4 mg via INTRAVENOUS

## 2015-09-11 MED ORDER — ONDANSETRON HCL 4 MG/2ML IJ SOLN
INTRAMUSCULAR | Status: AC
  Administered 2015-09-11: 4 mg via INTRAVENOUS
  Filled 2015-09-11: qty 2

## 2015-09-11 MED ORDER — SODIUM CHLORIDE 0.9 % IV SOLN
40.0000 mg | Freq: Once | INTRAVENOUS | Status: AC
Start: 2015-09-11 — End: 2015-09-11
  Administered 2015-09-11: 40 mg via INTRAVENOUS
  Filled 2015-09-11: qty 4

## 2015-09-11 MED ORDER — SODIUM CHLORIDE 0.9 % IV BOLUS (SEPSIS)
1000.0000 mL | Freq: Once | INTRAVENOUS | Status: AC
  Administered 2015-09-11: 1000 mL via INTRAVENOUS
  Filled 2015-09-11: qty 1000

## 2015-09-11 MED ORDER — MORPHINE SULFATE (PF) 4 MG/ML IV SOLN
4.0000 mg | Freq: Once | INTRAVENOUS | Status: AC
  Administered 2015-09-11: 4 mg via INTRAVENOUS
  Filled 2015-09-11: qty 1

## 2015-09-11 MED ORDER — HYDROMORPHONE HCL 1 MG/ML IJ SOLN
1.0000 mg | Freq: Once | INTRAMUSCULAR | Status: AC
  Administered 2015-09-11: 1 mg via INTRAVENOUS

## 2015-09-11 MED ORDER — NITROGLYCERIN 0.4 MG SL SUBL
0.4000 mg | SUBLINGUAL_TABLET | SUBLINGUAL | Status: DC | PRN
  Administered 2015-09-11 (×2): 0.4 mg via SUBLINGUAL
  Filled 2015-09-11: qty 3

## 2015-09-11 MED ORDER — GI COCKTAIL ~~LOC~~
30.0000 mL | Freq: Once | ORAL | Status: AC
  Administered 2015-09-11: 30 mL via ORAL
  Filled 2015-09-11: qty 30

## 2015-09-11 MED ORDER — HYDROMORPHONE HCL 1 MG/ML IJ SOLN
INTRAMUSCULAR | Status: AC
  Administered 2015-09-11: 1 mg via INTRAVENOUS
  Filled 2015-09-11: qty 1

## 2015-09-11 MED ORDER — ASPIRIN 81 MG PO CHEW
324.0000 mg | CHEWABLE_TABLET | Freq: Once | ORAL | Status: AC
Start: 2015-09-11 — End: 2015-09-11
  Administered 2015-09-11: 324 mg via ORAL
  Filled 2015-09-11: qty 4

## 2015-09-11 MED ORDER — IOPAMIDOL (ISOVUE-370) INJECTION 76%
125.0000 mL | Freq: Once | INTRAVENOUS | Status: AC | PRN
  Administered 2015-09-11: 125 mL via INTRAVENOUS

## 2015-09-11 MED ORDER — FAMOTIDINE IN NACL 20-0.9 MG/50ML-% IV SOLN
INTRAVENOUS | Status: AC
  Filled 2015-09-11: qty 50

## 2015-09-11 NOTE — ED Notes (Signed)
Third dose of Nitro not given due decrease in patients systolic blood pressure

## 2015-09-11 NOTE — ED Provider Notes (Signed)
[facility] Emergency Department Provider Note  ____________________________________________  Time seen: Approximately 9:39 AM  I have reviewed the triage vital signs and the nursing notes.   HISTORY  Chief Complaint Chest Pain   HPI Jack Sullivan is a 40 y.o. male history of the recurrent PEs on Xarelto, obesity, GERD, hypertension, OSA who presents for evaluation of chest pain. Patient reports his been having on and off chest pressure for the last week. He [place] the fle[place] this [place] sitting down when the pain came on. He reports that the pain is a 10 out of 10, crushing pressure, substernally, radiating to his back, associated with numbness in his left hand. He reports that the pain gets mildly worse with inspiration. He denies lightheadedness, vomiting, diaphoresis. He does endorse nausea associated with the pain. He reports his never had pain like this before and is different than his prior PEs. He has a strong family history on his father side of ischemic heart disease. He denies smoking history, alcohol, h/o PU, GIB, melena, hemoptysis, abdominal pain, fever, cough. He had a stress test 3-4 years ago and according to patient it was normal.  Past Medical History  Diagnosis Date  . PE (pulmonary embolism) Nov 2013    bilateral following R leg injury  . 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 Morbid obesity (HCC)   . GERD (gastroesophageal reflux disease)   . Hypertension   . Peripheral neuropathy (HCC)   . Chronic pain   . Depression   . OSA (obstructive sleep apnea)     sleep study done 05/2012  . Personal history of pulmonary embolism 12/28/2011    bilateral following R leg injury     Patient Active Problem List   Diagnosis Date Noted  . Insomnia 07/17/2015  . Moderate major depression, single episode (HCC) 07/15/2015  . Fatigue 05/02/2015  . Morbid obesity (HCC)   . GERD (gastroesophageal reflux disease)   .  Hypertension   . Peripheral neuropathy (HCC)   . Chronic pain   . OSA (obstructive sleep apnea)   . Personal history of pulmonary embolism 12/28/2011    Past Surgical History  Procedure Laterality Date  . Hernia repair    . Cardiovascular stress test  11/2012    normal    Current Outpatient Rx  Name  Route  Sig  Dispense  Refill  . amLODipine (NORVASC) 5 MG tablet   Oral   Take 1 tablet (5 mg total) by mouth daily.   30 tablet   6   . lisinopril (PRINIVIL,ZESTRIL) 20 MG tablet   Oral   Take 1 tablet (20 mg total) by mouth daily.   30 tablet   6   . omeprazole (PRILOSEC) 40 MG capsule   Oral   Take 1 capsule (40 mg total) by mouth daily.   30 capsule   6   . rivaroxaban (XARELTO) 20 MG TABS tablet   Oral   Take 1 tablet (20 mg total) by mouth daily with supper. Patient taking differently: Take 20 mg by mouth every morning.    30 tablet   6   . traZODone (DESYREL) 50 MG tablet   Oral   Take 1-2 tablets (50-100 mg total) by mouth at bedtime as needed for sleep.   60 tablet   3   . venlafaxine XR (EFFEXOR-XR) 150 MG 24 hr capsule   Oral   Take 1 capsule (150 mg  total) by mouth daily with breakfast. (this replaces Cymbalta / duloxetine)   30 capsule   2     This replaces Cymbalta / duloxetine     Allergies Vicodin  Family History  Problem Relation Age of Onset  . Heart disease Father   . Hypertension Father   . Diabetes Paternal Grandfather   . Heart disease Paternal Grandfather   . Hypertension Paternal Grandfather   . Cancer Neg Hx   . COPD Neg Hx   . Stroke Neg Hx     Social History Social History  Substance Use Topics  . Smoking status: Former Smoker -- 1.00 packs/day for 20 years    Types: Cigarettes    Quit date: 02/27/2008  . Smokeless tobacco: Never Used  . Alcohol Use: No    Review of Systems  Constitutional: Negative for fever. Eyes: Negative for visual changes. ENT: Negative for sore throat. Cardiovascular: + chest  pain. Respiratory: Negative for shortness of breath. Gastrointestinal: Negative for abdominal pain, vomiting or diarrhea. + nausea Genitourinary: Negative for dysuria. Musculoskeletal: Negative for back pain. Skin: Negative for rash. Neurological: Negative for headaches, weakness or numbness.  ____________________________________________   PHYSICAL EXAM:  VITAL SIGNS: ED Triage Vitals  Enc Vitals Group     BP 09/11/15 0915 151/78 mmHg     Pulse Rate 09/11/15 0915 83     Resp 09/11/15 0915 20     Temp 09/11/15 0915 97.6 F (36.4 C)     Temp Source 09/11/15 0915 Oral     SpO2 09/11/15 0915 98 %     Weight 09/11/15 0915 320 lb (145.151 kg)     Height 09/11/15 0915 5\' 7"  (1.702 m)     Head Cir --      Peak Flow --      Pain Score 09/11/15 0921 10     Pain Loc --      Pain Edu? --      Excl. in GC? --     Constitutional: Alert and oriented, mild distress due to pain.  HEENT:      Head: Normocephalic and atraumatic.         Eyes: Conjunctivae are normal. Sclera is non-icteric. EOMI. PERRL      Mouth/Throat: Mucous membranes are moist.       Neck: Supple with no signs of meningismus. Cardiovascular: Regular rate and rhythm. No murmurs, gallops, or rubs. 2+ symmetrical distal pulses are present in all extremities. No JVD. Respiratory: Normal respiratory effort. Lungs are clear to auscultation bilaterally. No wheezes, crackles, or rhonchi.  Gastrointestinal: Soft, non tender, and non distended with positive bowel sounds. No rebound or guarding. Genitourinary: No CVA tenderness. Musculoskeletal: Nontender with normal range of motion in all extremities. No edema, cyanosis, or erythema of extremities. Neurologic: Normal speech and language. Face is symmetric. Moving all extremities. No gross focal neurologic deficits are appreciated. Skin: Skin is warm, dry and intact. No rash noted. Psychiatric: Mood and affect are normal. Speech and behavior are  normal.  ____________________________________________   LABS (all labs ordered are listed, but only abnormal results are displayed)  Labs Reviewed  COMPREHENSIVE METABOLIC PANEL - Abnormal; Notable for the following:    Glucose, Bld 113 (*)    BUN 25 (*)    All other components within normal limits  PROTIME-INR - Abnormal; Notable for the following:    Prothrombin Time 21.4 (*)    All other components within normal limits  APTT  CBC WITH DIFFERENTIAL/PLATELET  TROPONIN I  LIPASE, BLOOD  TROPONIN I   ____________________________________________  EKG  ED ECG REPORT I, Nita Sicklearolina Alexiz Sustaita, the attending physician, personally viewed and interpreted this ECG.  Normal sinus rhythm, rate of 81, normal intervals, normal axis, no ST elevations or depressions, low voltage QRS. Unchanged from prior ____________________________________________  RADIOLOGY  CXR: Negative  CTA c/a/p: cholelithiasis  US RUQ: Cholelithiasis with no cholecystitis ____________________________________________   PROCEDURES  Procedure(s) performed: None Critical Care performed: yes  CRITICAL CARE Performed by: Nita Sicklearolina Notnamed Scholz  ?  Total critical care time: 40 min  Critical care time was exclusive of separately billable procedures and treating other patients.  Critical care was necessary to treat or prevent imminent or life-threatening deterioration.  Critical care was time spent personally by me on the following activities: development of treatment plan with patient and/or surrogate as well as nursing, discussions with consultants, evaluation of patient's response to treatment, examination of patient, obtaining history from patient or surrogate, ordering and performing treatments and interventions, ordering and review of laboratory studies, ordering and review of radiographic studies, pulse oximetry and re-evaluation of patient's  condition.  ____________________________________________   INITIAL IMPRESSION / ASSESSMENT AND PLAN / ED COURSE   40 y.o. male history of the recurrent PEs on Xarelto, obesity, GERD, hypertension, OSA who presents for evaluation of chest pain/ pressure since this morning associated with nausea, numbness of L hand, and radiating to the back. Patient mild distress, EKG nonischemic, patient has strong equal pulses in all 4 extremities, physical exams otherwise within normal limits. Differential diagnoses including PE versus ACS versus dissection versus GI pathology (GERD, esophageal spasm versus peptic ulcer disease). Will watch patient on telemetry. We'll give him sublingual nitroglycerin and aspirin. Will check labs, troponin, CXR. Will get CT to rule PE or dissection.  _________________________ 10:25 AM on 09/11/2015 -----------------------------------------  CP unchanged with sublingual nitro x 2. BP dropped to the 90s. Will give morphine and zofran and reassess after the CT. Repeat EKG unchanged   _________________________ 10:54 AM on 09/11/2015 -----------------------------------------  Patient now reports that pain is extending from the chest to his abdomen and he has mild ttp over the epigastric region. CT PE was replaced by CT dissection as we can evaluate for PE, dissection, or abdominal causes of this pain.  _________________________ 3:33 PM on 09/11/2015 -----------------------------------------  CT c/a/p showing cholelithiasis, RUQ US with no acute findings. Troponin x 2 negative. EKG x 2 negative. Patient reports markedly imporvement of his pain with GI cocktail and IV pepcid. Serial EKG and troponin with no evidence of ischemia. Patient already on omeprazole for GERD. Will dc home with close f/u with PCP, continue omeprazole. Discuss return precautions with patient and his wife who are comfortable with plan.   Pertinent labs & imaging results that were available during my  care of the patient were reviewed by me and considered in my medical decision making (see chart for details).    ____________________________________________   FINAL CLINICAL IMPRESSION(S) / ED DIAGNOSES  Final diagnoses:  Cholelithiasis  Chest pain, unspecified chest pain type      NEW MEDICATIONS STARTED DURING THIS VISIT:  New Prescriptions   No medications on file     Note:  This document was prepared using Dragon voice recognition software and may include unintentional dictation errors.    Nita Sicklearolina Krystine Pabst, MD 09/11/15 1537

## 2015-09-11 NOTE — Discharge Instructions (Signed)

## 2015-09-11 NOTE — ED Notes (Signed)
Pt reports chest pain for past couple of days with chest pain that radiates to back and numbness to fingers left hand. Denies SOB.

## 2015-09-12 ENCOUNTER — Ambulatory Visit: Payer: Self-pay | Admitting: Family Medicine

## 2015-12-29 ENCOUNTER — Emergency Department: Payer: Self-pay

## 2015-12-29 ENCOUNTER — Encounter: Payer: Self-pay | Admitting: Emergency Medicine

## 2015-12-29 ENCOUNTER — Emergency Department
Admission: EM | Admit: 2015-12-29 | Discharge: 2015-12-29 | Disposition: A | Discharge status: Home / Self Care | Payer: Self-pay | Attending: Emergency Medicine | Admitting: Emergency Medicine

## 2015-12-29 DIAGNOSIS — Z87891 Personal history of nicotine dependence: Secondary | ICD-10-CM | POA: Insufficient documentation

## 2015-12-29 DIAGNOSIS — Z79899 Other long term (current) drug therapy: Secondary | ICD-10-CM | POA: Insufficient documentation

## 2015-12-29 DIAGNOSIS — I10 Essential (primary) hypertension: Secondary | ICD-10-CM | POA: Insufficient documentation

## 2015-12-29 DIAGNOSIS — K8020 Calculus of gallbladder without cholecystitis without obstruction: Secondary | ICD-10-CM

## 2015-12-29 DIAGNOSIS — R109 Unspecified abdominal pain: Secondary | ICD-10-CM

## 2015-12-29 LAB — COMPREHENSIVE METABOLIC PANEL
ALT: 37 U/L (ref 17–63)
AST: 27 U/L (ref 15–41)
Albumin: 4.1 g/dL (ref 3.5–5.0)
Alkaline Phosphatase: 73 U/L (ref 38–126)
Anion gap: 6 (ref 5–15)
BILIRUBIN TOTAL: 0.1 mg/dL — AB (ref 0.3–1.2)
BUN: 23 mg/dL — AB (ref 6–20)
CHLORIDE: 107 mmol/L (ref 101–111)
CO2: 29 mmol/L (ref 22–32)
CREATININE: 1.04 mg/dL (ref 0.61–1.24)
Calcium: 9.1 mg/dL (ref 8.9–10.3)
Glucose, Bld: 110 mg/dL — ABNORMAL HIGH (ref 65–99)
POTASSIUM: 4.4 mmol/L (ref 3.5–5.1)
Sodium: 142 mmol/L (ref 135–145)
TOTAL PROTEIN: 7.3 g/dL (ref 6.5–8.1)

## 2015-12-29 LAB — CBC
HEMATOCRIT: 44.5 % (ref 40.0–52.0)
Hemoglobin: 15 g/dL (ref 13.0–18.0)
MCH: 28.9 pg (ref 26.0–34.0)
MCHC: 33.8 g/dL (ref 32.0–36.0)
MCV: 85.6 fL (ref 80.0–100.0)
PLATELETS: 203 10*3/uL (ref 150–440)
RBC: 5.2 MIL/uL (ref 4.40–5.90)
RDW: 13.4 % (ref 11.5–14.5)
WBC: 9.2 10*3/uL (ref 3.8–10.6)

## 2015-12-29 LAB — LIPASE, BLOOD: LIPASE: 32 U/L (ref 11–51)

## 2015-12-29 LAB — TROPONIN I

## 2015-12-29 MED ORDER — SODIUM CHLORIDE 0.9 % IV BOLUS (SEPSIS)
500.0000 mL | Freq: Once | INTRAVENOUS | Status: AC
  Administered 2015-12-29: 500 mL via INTRAVENOUS

## 2015-12-29 MED ORDER — MORPHINE SULFATE (PF) 2 MG/ML IV SOLN
4.0000 mg | Freq: Once | INTRAVENOUS | Status: AC
  Administered 2015-12-29: 4 mg via INTRAVENOUS
  Filled 2015-12-29: qty 2

## 2015-12-29 MED ORDER — GI COCKTAIL ~~LOC~~
30.0000 mL | Freq: Once | ORAL | Status: AC
  Administered 2015-12-29: 30 mL via ORAL
  Filled 2015-12-29: qty 30

## 2015-12-29 MED ORDER — FAMOTIDINE 40 MG PO TABS: 40.0000 mg | ORAL_TABLET | Freq: Every evening | ORAL | 1 refills | Status: AC

## 2015-12-29 MED ORDER — ONDANSETRON HCL 4 MG PO TABS: 4.0000 mg | ORAL_TABLET | Freq: Three times a day (TID) | ORAL | 0 refills | Status: DC | PRN

## 2015-12-29 NOTE — ED Provider Notes (Addendum)
[address] Emergency Department Provider Note  ____________________________________________   I have reviewed the triage vital signs and the nursing notes.   HISTORY  Chief Complaint Chest Pain    HPI Logan A Nedra HaiFlood is a 40 y.o. male with a history of a pulmonary emboli morbid obesity hypertension reflux but no heart problems on Xarelto for PE which he is compliant with. Patient has had epigastric abdominal discomfort. He does have a history of GERD. He states that the pain began around midnight gradually. He did vomit once. Nonbloody nonbilious. He had a normal bowel movement last night. No melena no bright red blood per rectum. When I ask her where it hurts he indicates his epigastric region. He does not she have any chest pain this is in the epigastrium. He has no right upper quadrant pain per se, mostly is localized to the center of his abdomen. He denies any fever or chills.  The pain is somewhat sharp, nonradiating. Sometimes he has a burning sensation in his throat.   Past Medical History:  Diagnosis Date  . Chronic pain   . Depression   . GERD (gastroesophageal reflux disease)   . Hypertension   . Morbid obesity (HCC)   . OSA (obstructive sleep apnea)    sleep study done 05/2012  . PE (pulmonary embolism) Nov 2013   bilateral following R leg injury  . 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 Peripheral neuropathy (HCC)   . Personal history of pulmonary embolism 12/28/2011   bilateral following R leg injury     Patient Active Problem List   Diagnosis Date Noted  . Insomnia 07/17/2015  . Moderate major depression, single episode (HCC) 07/15/2015  . Fatigue 05/02/2015  . Morbid obesity (HCC)   . GERD (gastroesophageal reflux disease)   . Hypertension   . Peripheral neuropathy (HCC)   . Chronic pain   . OSA (obstructive sleep apnea)   . Personal history of pulmonary embolism 12/28/2011    Past Surgical History:   Procedure Laterality Date  . CARDIOVASCULAR STRESS TEST  11/2012   normal  . HERNIA REPAIR      Prior to Admission medications   Medication Sig Start Date End Date Taking? Authorizing Provider  amLODipine (NORVASC) 5 MG tablet Take 1 tablet (5 mg total) by mouth daily. 07/06/15  Yes Kerman PasseyMelinda P Lada, MD  lisinopril (PRINIVIL,ZESTRIL) 20 MG tablet Take 1 tablet (20 mg total) by mouth daily. 07/06/15  Yes Kerman PasseyMelinda P Lada, MD  omeprazole (PRILOSEC) 40 MG capsule Take 1 capsule (40 mg total) by mouth daily. 07/06/15  Yes Kerman PasseyMelinda P Lada, MD  rivaroxaban (XARELTO) 20 MG TABS tablet Take 1 tablet (20 mg total) by mouth daily with supper. Patient taking differently: Take 20 mg by mouth every morning.  07/06/15  Yes Kerman PasseyMelinda P Lada, MD  traZODone (DESYREL) 50 MG tablet Take 1-2 tablets (50-100 mg total) by mouth at bedtime as needed for sleep. 08/12/15   Kerman PasseyMelinda P Lada, MD  venlafaxine XR (EFFEXOR-XR) 150 MG 24 hr capsule Take 1 capsule (150 mg total) by mouth daily with breakfast. (this replaces Cymbalta / duloxetine) 08/18/15   Kerman PasseyMelinda P Lada, MD    Allergies Vicodin [hydrocodone-acetaminophen]  Family History  Problem Relation Age of Onset  . Heart disease Father   . Hypertension Father   . Diabetes Paternal Grandfather   . Heart disease Paternal Grandfather   . Hypertension Paternal Grandfather   . Cancer Neg Hx   .  COPD Neg Hx   . Stroke Neg Hx     Social History Social History  Substance Use Topics  . Smoking status: Former Smoker    Packs/day: 1.00    Years: 20.00    Types: Cigarettes    Quit date: 02/27/2008  . Smokeless tobacco: Never Used  . Alcohol use No    Review of Systems Constitutional: No fever/chills Eyes: No visual changes. ENT: No sore throat. No stiff neck no neck pain Cardiovascular: Denies chest pain. Respiratory: Denies shortness of breath. Gastrointestinal:   See history of present illness regarding vomiting.  No diarrhea.  No constipation. Genitourinary:  Negative for dysuria. Musculoskeletal: Negative lower extremity swelling Skin: Negative for rash. Neurological: Negative for severe headaches, focal weakness or numbness. 10-point ROS otherwise negative.  ____________________________________________   PHYSICAL EXAM:  VITAL SIGNS: ED Triage Vitals [12/29/15 0615]  Enc Vitals Group     BP 126/68     Pulse Rate 69     Resp 18     Temp 98.7 F (37.1 C)     Temp Source Oral     SpO2 100 %     Weight (!) 330 lb (149.7 kg)     Height 5\' 8"  (1.727 m)     Head Circumference      Peak Flow      Pain Score 8     Pain Loc      Pain Edu?      Excl. in GC?     Constitutional: Alert and oriented. Well appearing and in no acute distress. Eyes: Conjunctivae are normal. PERRL. EOMI. Head: Atraumatic. Nose: No congestion/rhinnorhea. Mouth/Throat: Mucous membranes are moist.  Oropharynx non-erythematous. Neck: No stridor.   Nontender with no meningismus Cardiovascular: Normal rate, regular rhythm. Grossly normal heart sounds.  Good peripheral circulation. Respiratory: Normal respiratory effort.  No retractions. Lungs CTAB. Abdominal: Soft and test palpation in the epigastric region, minimal, no peritoneal signs, palpation of this area elicits the patient's discomfort.. No distention. No guarding no rebound Back:  There is no focal tenderness or step off.  there is no midline tenderness there are no lesions noted. there is no CVA tenderness Musculoskeletal: No lower extremity tenderness, no upper extremity tenderness. No joint effusions, no DVT signs strong distal pulses no edema Neurologic:  Normal speech and language. No gross focal neurologic deficits are appreciated.  Skin:  Skin is warm, dry and intact. No rash noted. Psychiatric: Mood and affect are normal. Speech and behavior are normal.  ____________________________________________   LABS (all labs ordered are listed, but only abnormal results are displayed)  Labs Reviewed   COMPREHENSIVE METABOLIC PANEL - Abnormal; Notable for the following:       Result Value   Glucose, Bld 110 (*)    BUN 23 (*)    Total Bilirubin 0.1 (*)    All other components within normal limits  CBC  TROPONIN I  LIPASE, BLOOD   ____________________________________________  EKG  I personally interpreted any EKGs ordered by me or triage Normal sinus rhythm rate 66 bpm no acute ST elevation or acute ST depression, normal axis ____________________________________________  RADIOLOGY  I reviewed any imaging ordered by me or triage that were performed during my shift and, if possible, patient and/or family made aware of any abnormal findings. ____________________________________________   PROCEDURES  Procedure(s) performed: None  Procedures  Critical Care performed: None  ____________________________________________   INITIAL IMPRESSION / ASSESSMENT AND PLAN / ED COURSE  Pertinent labs & imaging results  that were available during my care of the patient were reviewed by me and considered in my medical decision making (see chart for details).  Patient with reproducible epigastric abdominal discomfort and vomiting. No evidence of GI bleed despite Xarelto. Very low suspicion for PE or ACS given this presentation, he has no pleuritic pain he has no shortness of breath he has no actual chest pain. Gave him a GI cocktail and is feeling somewhat better. Liver function tests are fine, lipase is normal. We did check a troponin and despite 8 hours of constant pain it is negative. Chest x-ray is reassuring. Abdomen is nonsurgical. We will obtain an ultrasound given morbid obesity which may limit my ability to focalize right upper quadrant pain versus epigastric pain although to my exam everything is in the epigastrium. Patient has no evidence of peripheral bowel on exam. Certainly nothing to suggest appendicitis diverticulitis or ischemia. Nothing at this time to suggest AAA. In any event,  if patient's ultrasound is as reassuring as is exam and other findings, is my hope get him safely home.  ----------------------------------------- 9:29 AM on 12/29/2015 -----------------------------------------  Ultrasound does show gallbladder disease with stones but no evidence of acute cholecystitis white count is not elevated, liver fraction tests are not elevated, lipase is not elevated. Patient states his pain is down to a 4/5 out of 10 in getting gradually better. We will give him IV pain medication. He still has mostly pain in the epigastric region. Very well-appearing. No vomiting here. We'll recheck a troponin. Again, do not suspect nonpleuritic reproducible epigastric abdominal pain as a PE and I don't think a CT scan of his chest abdomen pelvis is warranted  ----------------------------------------- 10:49 AM on 12/29/2015 -----------------------------------------  Patient much workup wall, abdomen still nonsurgical still with minor reproducible principally epigastric but also some extent right upper quadrant abdominal discomfort. He feels much better. He and I discussed surgery, he would prefer to try to go home and take by mouth at this time if he can. I run this by the surgeons given the questionable stone in the neck and if they are okay with that plan we will discharge him.  ----------------------------------------- 11:19 AM on 12/29/2015 -----------------------------------------  Surgery apparently was in a procedure patient would like to try to eat, his pain is well-controlled we'll try by mouth challenge.  Marland Kitchen. ----------------------------------------- 11:51 AM on 12/29/2015 -----------------------------------------  ; Back from surgery, patient tolerating by mouth we will see if he will be dischargeable   ----------------------------------------- 12:25 PM on 12/29/2015 -----------------------------------------  D.w dr. Michela PitcherEly who agrees w/ mgt and d.c. Patient would very  much prefer to go home rather than stay in the hospital. He understands he can return at any time. Extensive return precautions and follow-up given and understood. Tolerated by mouth with no difficulty using her to leave.     Clinical Course   ____________________________________________   FINAL CLINICAL IMPRESSION(S) / ED DIAGNOSES  Final diagnoses:  Abdominal pain      This chart was dictated using voice recognition software.  Despite best efforts to proofread,  errors can occur which can change meaning.      Jeanmarie PlantJames A Franchon Ketterman, MD 12/29/15 [id number]    Jeanmarie PlantJames A Acheron Sugg, MD 12/29/15 0930    Jeanmarie PlantJames A Bassel Gaskill, MD 12/29/15 1050    Jeanmarie PlantJames A Chesney Klimaszewski, MD 12/29/15 1120    Jeanmarie PlantJames A Naisha Wisdom, MD 12/29/15 1151    Jeanmarie PlantJames A Leolia Vinzant, MD 12/29/15 1226    Jeanmarie PlantJames A Zeta Bucy, MD 12/29/15 [phone]

## 2015-12-29 NOTE — ED Notes (Signed)
Patient ate meal tray and tolerated well at this time.

## 2015-12-29 NOTE — ED Notes (Signed)
Patient transported to Ultrasound 

## 2015-12-29 NOTE — ED Notes (Signed)
Patient given meal tray per Dr. Iline OvenMcShane's request.

## 2015-12-29 NOTE — Discharge Instructions (Signed)
Return to the emergency department if you have fever, vomiting, bleeding, black stool, recurrent or increased pain, chest pain, shortness of breath or you feel worse in any way. Follow up with your primary care doctor and with surgery for your gallstones.

## 2015-12-29 NOTE — ED Triage Notes (Signed)
Patient ambulatory to triage with steady gait, without difficulty or distress noted. Pt reports since midnight having midsternal CP radiating into back accomp by nausea

## 2015-12-29 NOTE — ED Triage Notes (Signed)
Pt with co chest pain since 0000 has vomited x 1 no shob. Has hx of PE but denies any cardiac history.

## 2016-02-03 ENCOUNTER — Other Ambulatory Visit: Payer: Self-pay | Admitting: Family Medicine

## 2016-02-03 MED ORDER — LISINOPRIL 20 MG PO TABS: 20.0000 mg | ORAL_TABLET | Freq: Every day | ORAL | 1 refills | Status: AC

## 2016-02-03 MED ORDER — AMLODIPINE BESYLATE 5 MG PO TABS: 5.0000 mg | ORAL_TABLET | Freq: Every day | ORAL | 1 refills | Status: AC

## 2016-02-03 NOTE — Telephone Encounter (Signed)
cmp reviewed Nov 2017; Rxs approved

## 2016-04-12 ENCOUNTER — Observation Stay: Payer: Self-pay | Admitting: Registered Nurse

## 2016-04-12 ENCOUNTER — Emergency Department: Payer: Self-pay

## 2016-04-12 ENCOUNTER — Encounter
Admission: EM | Disposition: A | Discharge status: Home / Self Care | Payer: Self-pay | Source: Home / Self Care | Attending: Emergency Medicine

## 2016-04-12 ENCOUNTER — Encounter: Payer: Self-pay | Admitting: Emergency Medicine

## 2016-04-12 ENCOUNTER — Observation Stay
Admission: EM | Admit: 2016-04-12 | Discharge: 2016-04-14 | Disposition: A | Discharge status: Home / Self Care | Payer: Self-pay | Attending: Specialist | Admitting: Specialist

## 2016-04-12 DIAGNOSIS — Z23 Encounter for immunization: Secondary | ICD-10-CM | POA: Insufficient documentation

## 2016-04-12 DIAGNOSIS — K2289 Other specified disease of esophagus: Secondary | ICD-10-CM

## 2016-04-12 DIAGNOSIS — Z7901 Long term (current) use of anticoagulants: Secondary | ICD-10-CM | POA: Insufficient documentation

## 2016-04-12 DIAGNOSIS — Z885 Allergy status to narcotic agent status: Secondary | ICD-10-CM | POA: Insufficient documentation

## 2016-04-12 DIAGNOSIS — Z86718 Personal history of other venous thrombosis and embolism: Secondary | ICD-10-CM | POA: Insufficient documentation

## 2016-04-12 DIAGNOSIS — E669 Obesity, unspecified: Secondary | ICD-10-CM | POA: Insufficient documentation

## 2016-04-12 DIAGNOSIS — K228 Other specified diseases of esophagus: Secondary | ICD-10-CM | POA: Insufficient documentation

## 2016-04-12 DIAGNOSIS — I272 Pulmonary hypertension, unspecified: Secondary | ICD-10-CM | POA: Insufficient documentation

## 2016-04-12 DIAGNOSIS — G629 Polyneuropathy, unspecified: Secondary | ICD-10-CM | POA: Insufficient documentation

## 2016-04-12 DIAGNOSIS — G8929 Other chronic pain: Secondary | ICD-10-CM | POA: Insufficient documentation

## 2016-04-12 DIAGNOSIS — K449 Diaphragmatic hernia without obstruction or gangrene: Secondary | ICD-10-CM | POA: Insufficient documentation

## 2016-04-12 DIAGNOSIS — R1011 Right upper quadrant pain: Secondary | ICD-10-CM | POA: Insufficient documentation

## 2016-04-12 DIAGNOSIS — R109 Unspecified abdominal pain: Secondary | ICD-10-CM

## 2016-04-12 DIAGNOSIS — R1013 Epigastric pain: Principal | ICD-10-CM | POA: Diagnosis present

## 2016-04-12 DIAGNOSIS — F329 Major depressive disorder, single episode, unspecified: Secondary | ICD-10-CM | POA: Insufficient documentation

## 2016-04-12 DIAGNOSIS — I10 Essential (primary) hypertension: Secondary | ICD-10-CM | POA: Insufficient documentation

## 2016-04-12 DIAGNOSIS — R079 Chest pain, unspecified: Secondary | ICD-10-CM | POA: Insufficient documentation

## 2016-04-12 DIAGNOSIS — G4733 Obstructive sleep apnea (adult) (pediatric): Secondary | ICD-10-CM | POA: Insufficient documentation

## 2016-04-12 DIAGNOSIS — Z87891 Personal history of nicotine dependence: Secondary | ICD-10-CM | POA: Insufficient documentation

## 2016-04-12 DIAGNOSIS — Z86711 Personal history of pulmonary embolism: Secondary | ICD-10-CM | POA: Insufficient documentation

## 2016-04-12 DIAGNOSIS — Z6841 Body Mass Index (BMI) 40.0 and over, adult: Secondary | ICD-10-CM | POA: Insufficient documentation

## 2016-04-12 DIAGNOSIS — K219 Gastro-esophageal reflux disease without esophagitis: Secondary | ICD-10-CM | POA: Insufficient documentation

## 2016-04-12 DIAGNOSIS — K8020 Calculus of gallbladder without cholecystitis without obstruction: Secondary | ICD-10-CM | POA: Insufficient documentation

## 2016-04-12 HISTORY — PX: ESOPHAGOGASTRODUODENOSCOPY (EGD) WITH PROPOFOL: SHX5813

## 2016-04-12 LAB — LIPASE, BLOOD: Lipase: 32 U/L (ref 11–51)

## 2016-04-12 LAB — CBC
HEMATOCRIT: 44.2 % (ref 40.0–52.0)
Hemoglobin: 14.5 g/dL (ref 13.0–18.0)
MCH: 28.4 pg (ref 26.0–34.0)
MCHC: 32.9 g/dL (ref 32.0–36.0)
MCV: 86.1 fL (ref 80.0–100.0)
Platelets: 232 10*3/uL (ref 150–440)
RBC: 5.13 MIL/uL (ref 4.40–5.90)
RDW: 13.7 % (ref 11.5–14.5)
WBC: 9.1 10*3/uL (ref 3.8–10.6)

## 2016-04-12 LAB — BASIC METABOLIC PANEL
Anion gap: 7 (ref 5–15)
BUN: 24 mg/dL — ABNORMAL HIGH (ref 6–20)
CHLORIDE: 106 mmol/L (ref 101–111)
CO2: 29 mmol/L (ref 22–32)
CREATININE: 0.98 mg/dL (ref 0.61–1.24)
Calcium: 9.2 mg/dL (ref 8.9–10.3)
GFR calc non Af Amer: >60 mL/min (ref >60)
Glucose, Bld: 118 mg/dL — ABNORMAL HIGH (ref 65–99)
POTASSIUM: 4.6 mmol/L (ref 3.5–5.1)
SODIUM: 142 mmol/L (ref 135–145)

## 2016-04-12 LAB — HEPATIC FUNCTION PANEL
ALBUMIN: 4.1 g/dL (ref 3.5–5.0)
ALK PHOS: 86 U/L (ref 38–126)
ALT: 31 U/L (ref 17–63)
AST: 28 U/L (ref 15–41)
BILIRUBIN TOTAL: 0.7 mg/dL (ref 0.3–1.2)
Bilirubin, Direct: <0.1 mg/dL — ABNORMAL LOW (ref 0.1–0.5)
TOTAL PROTEIN: 7.7 g/dL (ref 6.5–8.1)

## 2016-04-12 LAB — PROTIME-INR
INR: 1.59
Prothrombin Time: 19.1 seconds — ABNORMAL HIGH (ref 11.4–15.2)

## 2016-04-12 LAB — APTT: aPTT: 32 seconds (ref 24–36)

## 2016-04-12 LAB — TROPONIN I: Troponin I: <0.03 ng/mL (ref <0.03)

## 2016-04-12 SURGERY — ESOPHAGOGASTRODUODENOSCOPY (EGD) WITH PROPOFOL
Anesthesia: General

## 2016-04-12 MED ORDER — NITROGLYCERIN 0.4 MG SL SUBL
SUBLINGUAL_TABLET | SUBLINGUAL | Status: AC
  Administered 2016-04-12: 0.4 mg via SUBLINGUAL
  Filled 2016-04-12: qty 1

## 2016-04-12 MED ORDER — LIDOCAINE HCL (CARDIAC) 20 MG/ML IV SOLN
INTRAVENOUS | Status: DC | PRN
  Administered 2016-04-12: 60 mg via INTRATRACHEAL

## 2016-04-12 MED ORDER — INFLUENZA VAC SPLIT QUAD 0.5 ML IM SUSY
0.5000 mL | PREFILLED_SYRINGE | INTRAMUSCULAR | Status: AC
  Administered 2016-04-14: 0.5 mL via INTRAMUSCULAR
  Filled 2016-04-12: qty 0.5

## 2016-04-12 MED ORDER — ONDANSETRON HCL 4 MG/2ML IJ SOLN
INTRAMUSCULAR | Status: AC
  Administered 2016-04-12: 4 mg via INTRAVENOUS
  Filled 2016-04-12: qty 2

## 2016-04-12 MED ORDER — NITROGLYCERIN 0.4 MG SL SUBL
0.4000 mg | SUBLINGUAL_TABLET | SUBLINGUAL | Status: DC | PRN
  Administered 2016-04-12 (×2): 0.4 mg via SUBLINGUAL

## 2016-04-12 MED ORDER — PANTOPRAZOLE SODIUM 40 MG IV SOLR
40.0000 mg | Freq: Two times a day (BID) | INTRAVENOUS | Status: DC
  Administered 2016-04-12 – 2016-04-14 (×4): 40 mg via INTRAVENOUS
  Filled 2016-04-12 (×4): qty 40

## 2016-04-12 MED ORDER — ONDANSETRON HCL 4 MG/2ML IJ SOLN
4.0000 mg | Freq: Once | INTRAMUSCULAR | Status: AC
  Administered 2016-04-12: 4 mg via INTRAVENOUS

## 2016-04-12 MED ORDER — ENOXAPARIN SODIUM 150 MG/ML ~~LOC~~ SOLN
1.0000 mg/kg | Freq: Two times a day (BID) | SUBCUTANEOUS | Status: DC
  Administered 2016-04-13 – 2016-04-14 (×3): 145 mg via SUBCUTANEOUS
  Filled 2016-04-12 (×4): qty 0.97

## 2016-04-12 MED ORDER — MORPHINE SULFATE (PF) 4 MG/ML IV SOLN
4.0000 mg | Freq: Once | INTRAVENOUS | Status: AC
  Administered 2016-04-12: 4 mg via INTRAVENOUS

## 2016-04-12 MED ORDER — DOCUSATE SODIUM 100 MG PO CAPS: 100.0000 mg | ORAL_CAPSULE | Freq: Two times a day (BID) | ORAL | Status: DC | PRN

## 2016-04-12 MED ORDER — ONDANSETRON HCL 4 MG PO TABS: 4.0000 mg | ORAL_TABLET | Freq: Three times a day (TID) | ORAL | Status: DC | PRN

## 2016-04-12 MED ORDER — LISINOPRIL 20 MG PO TABS
20.0000 mg | ORAL_TABLET | Freq: Every day | ORAL | Status: DC
  Administered 2016-04-14: 20 mg via ORAL
  Filled 2016-04-12: qty 1

## 2016-04-12 MED ORDER — PROPOFOL 500 MG/50ML IV EMUL
INTRAVENOUS | Status: AC
  Filled 2016-04-12: qty 50

## 2016-04-12 MED ORDER — IOPAMIDOL (ISOVUE-370) INJECTION 76%
100.0000 mL | Freq: Once | INTRAVENOUS | Status: AC | PRN
  Administered 2016-04-12: 100 mL via INTRAVENOUS

## 2016-04-12 MED ORDER — NITROFURANTOIN MONOHYD MACRO 100 MG PO CAPS: 100.0000 mg | ORAL_CAPSULE | Freq: Once | ORAL | Status: DC

## 2016-04-12 MED ORDER — PROPOFOL 500 MG/50ML IV EMUL
INTRAVENOUS | Status: DC | PRN
  Administered 2016-04-12: 160 ug/kg/min via INTRAVENOUS

## 2016-04-12 MED ORDER — PROPOFOL 10 MG/ML IV BOLUS
INTRAVENOUS | Status: DC | PRN
  Administered 2016-04-12: 70 mg via INTRAVENOUS

## 2016-04-12 MED ORDER — LIDOCAINE HCL (PF) 2 % IJ SOLN
INTRAMUSCULAR | Status: AC
  Filled 2016-04-12: qty 2

## 2016-04-12 MED ORDER — VENLAFAXINE HCL ER 75 MG PO CP24
150.0000 mg | ORAL_CAPSULE | Freq: Every day | ORAL | Status: DC
  Filled 2016-04-12: qty 2

## 2016-04-12 MED ORDER — AMLODIPINE BESYLATE 5 MG PO TABS
5.0000 mg | ORAL_TABLET | Freq: Every day | ORAL | Status: DC
  Administered 2016-04-14: 5 mg via ORAL
  Filled 2016-04-12: qty 1

## 2016-04-12 MED ORDER — IOPAMIDOL (ISOVUE-300) INJECTION 61%: 30.0000 mL | Freq: Once | INTRAVENOUS | Status: DC | PRN

## 2016-04-12 MED ORDER — SODIUM CHLORIDE 0.9 % IV SOLN
INTRAVENOUS | Status: DC
  Administered 2016-04-12 (×2): via INTRAVENOUS
  Administered 2016-04-13: 75 mL/h via INTRAVENOUS
  Administered 2016-04-13 – 2016-04-14 (×2): via INTRAVENOUS

## 2016-04-12 MED ORDER — RIVAROXABAN 20 MG PO TABS: 20.0000 mg | ORAL_TABLET | Freq: Every day | ORAL | Status: DC

## 2016-04-12 MED ORDER — MORPHINE SULFATE (PF) 4 MG/ML IV SOLN
4.0000 mg | Freq: Once | INTRAVENOUS | Status: AC
Start: 2016-04-12 — End: 2016-04-12
  Administered 2016-04-12: 4 mg via INTRAVENOUS
  Filled 2016-04-12: qty 1

## 2016-04-12 MED ORDER — GI COCKTAIL ~~LOC~~
30.0000 mL | Freq: Once | ORAL | Status: AC
  Administered 2016-04-12: 30 mL via ORAL
  Filled 2016-04-12: qty 30

## 2016-04-12 MED ORDER — TRAZODONE HCL 50 MG PO TABS: 50.0000 mg | ORAL_TABLET | Freq: Every evening | ORAL | Status: DC | PRN

## 2016-04-12 MED ORDER — MORPHINE SULFATE (PF) 4 MG/ML IV SOLN
INTRAVENOUS | Status: AC
  Administered 2016-04-12: 4 mg via INTRAVENOUS
  Filled 2016-04-12: qty 1

## 2016-04-12 MED ORDER — MORPHINE SULFATE (PF) 2 MG/ML IV SOLN
2.0000 mg | INTRAVENOUS | Status: DC | PRN
  Administered 2016-04-12 – 2016-04-13 (×3): 2 mg via INTRAVENOUS
  Filled 2016-04-12 (×3): qty 1

## 2016-04-12 NOTE — Consult Note (Signed)
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 pt to possible have porcedure, MD wants to switch from xarelto to enoxaparin  Allergies  Allergen Reactions  . Vicodin [Hydrocodone-Acetaminophen] Other (See Comments)    Nose bleed    Patient Measurements: Height: 5\' 8"  (172.7 cm) Weight: (!) 320 lb (145.2 kg) IBW/kg (Calculated) : 68.4 Heparin Dosing Weight:   Vital Signs: Temp: 97.4 F (36.3 C) (02/15 1510) Temp Source: Tympanic (02/15 1510) BP: 139/90 (02/15 1540) Pulse Rate: 63 (02/15 1540)  Labs:  Recent Labs  04/12/16 0547 04/12/16 0843  HGB 14.5  --   HCT 44.2  --   PLT 232  --   APTT  --  32  LABPROT  --  19.1*  INR  --  1.59  CREATININE 0.98  --   TROPONINI <0.03  --     Estimated Creatinine Clearance: 139 mL/min (by C-G formula based on SCr of 0.98 mg/dL).   Medical History: Past Medical History:  Diagnosis Date  . Chronic pain   . Depression   . GERD (gastroesophageal reflux disease)   . Hypertension   . Morbid obesity (HCC)   . OSA (obstructive sleep apnea)    sleep study done 05/2012  . PE (pulmonary embolism) Nov 2013   bilateral following R leg injury  . 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 Peripheral neuropathy (HCC)   . Personal history of pulmonary embolism 12/28/2011   bilateral following R leg injury     Medications:  Scheduled:  . [START ON 04/13/2016] amLODipine  5 mg Oral Daily  . [START ON 04/13/2016] enoxaparin (LOVENOX) injection  1 mg/kg Subcutaneous Q12H  . [START ON 04/13/2016] lisinopril  20 mg Oral Daily  . pantoprazole (PROTONIX) IV  40 mg Intravenous Q12H  . [START ON 04/13/2016] venlafaxine XR  150 mg Oral Q breakfast    Assessment: Pt is a 41 year old male w/ a PMH of DVT/PE who takes xarelto at home. Last dose 0500 this AM (2/15). Pt is to go for HIDA scan and may possibly need surgery. Dr. Everlene FarrierPabon requested  that pt be transitioned to enoxaparin.   Goal of Therapy:   Monitor platelets by anticoagulation protocol: Yes   Plan:  Lovenox 1mg /kg q 12 hours starting at least 24 hours after last xarelto dose. Will give at 0600 tomorrow AM. CBC and scr at least q 72 hours.  Olene FlossMelissa D Quy Lotts, Pharm.D, BCPS Clinical Pharmacist  04/12/2016,4:27 PM

## 2016-04-12 NOTE — H&P (Signed)
[facility] [place] [facility]   PATIENT NAME: Jack Sullivan    MR#:  [id number]  DATE OF BIRTH:  [birth year]  DATE OF ADMISSION:  04/12/2016  PRIMARY CARE PHYSICIAN: Baruch Gouty, MD   REQUESTING/REFERRING PHYSICIAN: Cyril Loosen  CHIEF COMPLAINT:   Chief Complaint  Patient presents with  . Chest Pain    HISTORY OF PRESENT ILLNESS: Jack Sullivan  is a 41 y.o. male with a known history of Depression, hypertension, morbid obesity, obstructive sleep apnea, pulmonary embolism and DVT in the past- since last night he started having epigastric pain which is radiating to his back. He denies any associated nausea, diarrhea, blood in stool. He denies use of over-the-counter pain medications.. He denies any use of alcohol. In ER his ultrasound and CT scan of abdomen and chest where showing cholelithiasis but no surrounding thickening and his LFTs were normal so surgical consult was done who suggested that the pain is not from the gallstone and he may not need any further intervention from their side. No evidence of pulmonary embolism on chest CT. As pain was intractable, medical consult was called in for admission. Patient had not had anything to eat since morning, I spoke to GI doctor on-call and he agreed to see the patient and possibly doing an endoscopy today.  PAST MEDICAL HISTORY:   Past Medical History:  Diagnosis Date  . Chronic pain   . Depression   . GERD (gastroesophageal reflux disease)   . Hypertension   . Morbid obesity (HCC)   . OSA (obstructive sleep apnea)    sleep study done 05/2012  . PE (pulmonary embolism) Nov 2013   bilateral following R leg injury  . 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 Peripheral neuropathy (HCC)   . Personal history of pulmonary embolism 12/28/2011   bilateral following R leg injury     PAST SURGICAL HISTORY: Past Surgical History:  Procedure Laterality Date  . CARDIOVASCULAR STRESS TEST  11/2012   normal   . HERNIA REPAIR      SOCIAL HISTORY:  Social History  Substance Use Topics  . Smoking status: Former Smoker    Packs/day: 1.00    Years: 20.00    Types: Cigarettes    Quit date: 02/27/2008  . Smokeless tobacco: Never Used  . Alcohol use No    FAMILY HISTORY:  Family History  Problem Relation Age of Onset  . Heart disease Father   . Hypertension Father   . Lung cancer Mother   . Diabetes Paternal Grandfather   . Heart disease Paternal Grandfather   . Hypertension Paternal Grandfather   . Cancer Neg Hx   . COPD Neg Hx   . Stroke Neg Hx     DRUG ALLERGIES:  Allergies  Allergen Reactions  . Vicodin [Hydrocodone-Acetaminophen] Other (See Comments)    Nose bleed    REVIEW OF SYSTEMS:   CONSTITUTIONAL: No fever, fatigue or weakness.  EYES: No blurred or double vision.  EARS, NOSE, AND THROAT: No tinnitus or ear pain.  RESPIRATORY: No cough, shortness of breath, wheezing or hemoptysis.  CARDIOVASCULAR: No chest pain, orthopnea, edema.  GASTROINTESTINAL: No nausea, vomiting, diarrhea, positive for epigastric abdominal pain.  GENITOURINARY: No dysuria, hematuria.  ENDOCRINE: No polyuria, nocturia,  HEMATOLOGY: No anemia, easy bruising or bleeding SKIN: No rash or lesion. MUSCULOSKELETAL: No joint pain or arthritis.   NEUROLOGIC: No tingling, numbness, weakness.  PSYCHIATRY: No anxiety or depression.   MEDICATIONS AT HOME:  Prior to Admission medications   Medication Sig Start Date End Date Taking? Authorizing Provider  amLODipine (NORVASC) 5 MG tablet Take 1 tablet (5 mg total) by mouth daily. 02/03/16  Yes Kerman PasseyMelinda P Lada, MD  lisinopril (PRINIVIL,ZESTRIL) 20 MG tablet Take 1 tablet (20 mg total) by mouth daily. 02/03/16  Yes Kerman PasseyMelinda P Lada, MD  omeprazole (PRILOSEC) 20 MG capsule Take 1 capsule (20 mg total) by mouth 2 (two) times daily before a meal. If needed; long-term use carries risk; talk to your doctor Patient taking differently: Take 40 mg by mouth daily. If  needed; long-term use carries risk; talk to your doctor 02/03/16  Yes Kerman PasseyMelinda P Lada, MD  traZODone (DESYREL) 50 MG tablet Take 1-2 tablets (50-100 mg total) by mouth at bedtime as needed for sleep. 08/12/15  Yes Kerman PasseyMelinda P Lada, MD  XARELTO 20 MG TABS tablet TAKE 1 TABLET BY MOUTH ONCE DAILY WITH SUPPER 02/03/16  Yes Kerman PasseyMelinda P Lada, MD  famotidine (PEPCID) 40 MG tablet Take 1 tablet (40 mg total) by mouth every evening. 12/29/15 12/28/16  Jeanmarie PlantJames A McShane, MD  ondansetron (ZOFRAN) 4 MG tablet Take 1 tablet (4 mg total) by mouth every 8 (eight) hours as needed for nausea or vomiting. 12/29/15   Jeanmarie PlantJames A McShane, MD  venlafaxine XR (EFFEXOR-XR) 150 MG 24 hr capsule Take 1 capsule (150 mg total) by mouth daily with breakfast. (this replaces Cymbalta / duloxetine) 08/18/15   Kerman PasseyMelinda P Lada, MD      PHYSICAL EXAMINATION:   VITAL SIGNS: Blood pressure 118/62, pulse 85, temperature 98 F (36.7 C), temperature source Oral, resp. rate 14, height 5\' 8"  (1.727 m), weight (!) 145.2 kg (320 lb), SpO2 94 %.  GENERAL:  41 y.o.-year-old patient lying in the bed with no acute distress.  EYES: Pupils equal, round, reactive to light and accommodation. No scleral icterus. Extraocular muscles intact.  HEENT: Head atraumatic, normocephalic. Oropharynx and nasopharynx clear.  NECK:  Supple, no jugular venous distention. No thyroid enlargement, no tenderness.  LUNGS: Normal breath sounds bilaterally, no wheezing, rales,rhonchi or crepitation. No use of accessory muscles of respiration.  CARDIOVASCULAR: S1, S2 normal. No murmurs, rubs, or gallops.  ABDOMEN: Soft,  Epigastric tender, nondistended. Bowel sounds present. No organomegaly or mass.  EXTREMITIES: No pedal edema, cyanosis, or clubbing.  NEUROLOGIC: Cranial nerves II through XII are intact. Muscle strength 5/5 in all extremities. Sensation intact. Gait not checked.  PSYCHIATRIC: The patient is alert and oriented x 3.  SKIN: No obvious rash, lesion, or ulcer.    LABORATORY PANEL:   CBC  Recent Labs Lab 04/12/16 0547  WBC 9.1  HGB 14.5  HCT 44.2  PLT 232  MCV 86.1  MCH 28.4  MCHC 32.9  RDW 13.7   ------------------------------------------------------------------------------------------------------------------  Chemistries   Recent Labs Lab 04/12/16 0547  NA 142  K 4.6  CL 106  CO2 29  GLUCOSE 118*  BUN 24*  CREATININE 0.98  CALCIUM 9.2  AST 28  ALT 31  ALKPHOS 86  BILITOT 0.7   ------------------------------------------------------------------------------------------------------------------ estimated creatinine clearance is 139 mL/min (by C-G formula based on SCr of 0.98 mg/dL). ------------------------------------------------------------------------------------------------------------------ No results for input(s): TSH, T4TOTAL, T3FREE, THYROIDAB in the last 72 hours.  Invalid input(s): FREET3   Coagulation profile  Recent Labs Lab 04/12/16 0843  INR 1.59   ------------------------------------------------------------------------------------------------------------------- No results for input(s): DDIMER in the last 72 hours. -------------------------------------------------------------------------------------------------------------------  Cardiac Enzymes  Recent Labs Lab 04/12/16 0547  TROPONINI <0.03   ------------------------------------------------------------------------------------------------------------------ Invalid input(s): POCBNP  ---------------------------------------------------------------------------------------------------------------  Urinalysis    Component Value Date/Time   COLORURINE Amber 11/28/2011 2218   APPEARANCEUR Cloudy 11/28/2011 2218   LABSPEC 1.034 11/28/2011 2218   PHURINE 5.0 11/28/2011 2218   GLUCOSEU Negative 11/28/2011 2218   HGBUR Negative 11/28/2011 2218   BILIRUBINUR neg 07/15/2015 1050   BILIRUBINUR Negative 11/28/2011 2218   KETONESUR Negative  11/28/2011 2218   PROTEINUR neg 07/15/2015 1050   PROTEINUR 30 mg/dL 16/11/9602 5409   UROBILINOGEN 0.2 07/15/2015 1050   NITRITE neg 07/15/2015 1050   NITRITE Negative 11/28/2011 2218   LEUKOCYTESUR Negative 07/15/2015 1050   LEUKOCYTESUR Negative 11/28/2011 2218     RADIOLOGY: Dg Chest 2 View  Result Date: 04/12/2016 CLINICAL DATA:  Sharp chest pain since midnight. History of blood clots in the lungs. EXAM: CHEST  2 VIEW COMPARISON:  12/29/2015 FINDINGS: Slightly shallow inspiration. Heart size and pulmonary vascularity are normal. No focal airspace disease or consolidation in the lungs. Blunting of the right costophrenic angle is unchanged since prior study and may represent pleural thickening. IMPRESSION: No active cardiopulmonary disease. Electronically Signed   By: Burman Nieves M.D.   On: 04/12/2016 06:25   Ct Angio Chest Pe W Or Wo Contrast  Result Date: 04/12/2016 CLINICAL DATA:  Epigastric pain radiating to back. EXAM: CT ANGIOGRAPHY CHEST CT ABDOMEN AND PELVIS WITH CONTRAST TECHNIQUE: Multidetector CT imaging of the chest was performed using the standard protocol during bolus administration of intravenous contrast. Multiplanar CT image reconstructions and MIPs were obtained to evaluate the vascular anatomy. Multidetector CT imaging of the abdomen and pelvis was performed using the standard protocol during bolus administration of intravenous contrast. CONTRAST:  100 cc Isovue 370 IV COMPARISON:  Chest CT 03/01/2013.  CT abdomen and pelvis 09/11/2015. FINDINGS: CTA CHEST FINDINGS Cardiovascular: No filling defects in the pulmonary arteries to suggest pulmonary emboli. Heart and mediastinal contours are within normal limits. No focal opacities or effusions. No acute bony abnormality. Mediastinum/Nodes: No mediastinal, hilar, or axillary adenopathy. Lungs/Pleura: Lungs are clear. No focal airspace opacities or suspicious nodules. No effusions. Musculoskeletal: No acute bony abnormality or  focal bone lesion. Review of the MIP images confirms the above findings. CT ABDOMEN and PELVIS FINDINGS Hepatobiliary: Numerous gallstones within the gallbladder. No focal hepatic abnormality. No biliary ductal dilatation. Pancreas: No focal abnormality or ductal dilatation. Spleen: No focal abnormality.  Normal size. Adrenals/Urinary Tract: No adrenal abnormality. No focal renal abnormality. No stones or hydronephrosis. Urinary bladder is unremarkable. Stomach/Bowel: Stomach, large and small bowel grossly unremarkable. Vascular/Lymphatic: No evidence of aneurysm or adenopathy. Reproductive: No visible focal abnormality. Other: No free fluid or free air. Musculoskeletal: No acute bony abnormality or focal bone lesion. Review of the MIP images confirms the above findings. IMPRESSION: No evidence of pulmonary embolus.  No acute cardiopulmonary disease. Cholelithiasis. No acute findings in the abdomen or pelvis. Electronically Signed   By: Charlett Nose M.D.   On: 04/12/2016 11:10   Ct Abdomen Pelvis W Contrast  Result Date: 04/12/2016 CLINICAL DATA:  Epigastric pain radiating to back. EXAM: CT ANGIOGRAPHY CHEST CT ABDOMEN AND PELVIS WITH CONTRAST TECHNIQUE: Multidetector CT imaging of the chest was performed using the standard protocol during bolus administration of intravenous contrast. Multiplanar CT image reconstructions and MIPs were obtained to evaluate the vascular anatomy. Multidetector CT imaging of the abdomen and pelvis was performed using the standard protocol during bolus administration of intravenous contrast. CONTRAST:  100 cc Isovue 370 IV COMPARISON:  Chest CT 03/01/2013.  CT abdomen and pelvis 09/11/2015. FINDINGS: CTA  CHEST FINDINGS Cardiovascular: No filling defects in the pulmonary arteries to suggest pulmonary emboli. Heart and mediastinal contours are within normal limits. No focal opacities or effusions. No acute bony abnormality. Mediastinum/Nodes: No mediastinal, hilar, or axillary  adenopathy. Lungs/Pleura: Lungs are clear. No focal airspace opacities or suspicious nodules. No effusions. Musculoskeletal: No acute bony abnormality or focal bone lesion. Review of the MIP images confirms the above findings. CT ABDOMEN and PELVIS FINDINGS Hepatobiliary: Numerous gallstones within the gallbladder. No focal hepatic abnormality. No biliary ductal dilatation. Pancreas: No focal abnormality or ductal dilatation. Spleen: No focal abnormality.  Normal size. Adrenals/Urinary Tract: No adrenal abnormality. No focal renal abnormality. No stones or hydronephrosis. Urinary bladder is unremarkable. Stomach/Bowel: Stomach, large and small bowel grossly unremarkable. Vascular/Lymphatic: No evidence of aneurysm or adenopathy. Reproductive: No visible focal abnormality. Other: No free fluid or free air. Musculoskeletal: No acute bony abnormality or focal bone lesion. Review of the MIP images confirms the above findings. IMPRESSION: No evidence of pulmonary embolus.  No acute cardiopulmonary disease. Cholelithiasis. No acute findings in the abdomen or pelvis. Electronically Signed   By: Charlett Nose M.D.   On: 04/12/2016 11:10   US Abdomen Limited Ruq  Result Date: 04/12/2016 CLINICAL DATA:  6 hours of abdominal pain associated with vomiting. History of previous umbilical hernia repair. Known gallstones. EXAM: US ABDOMEN LIMITED - RIGHT UPPER QUADRANT COMPARISON:  Right upper quadrant abdominal ultrasound of December 29, 2015 FINDINGS: Gallbladder: The gallbladder is adequately distended. There are multiple echogenic mobile shadowing stones. A non mobile stone is demonstrated in the neck which measures 16 mm in diameter. The gallbladder wall is top normal at 3.3 mm. There is no pericholecystic fluid or positive sonographic Murphy's sign. Common bile duct: Diameter: 5.1 mm Liver: The hepatic echotexture is mildly increased diffusely. There is no focal mass nor ductal dilation. The surface contour of the liver is  normal. IMPRESSION: Multiple gallstones. One stone appears impacted in the gallbladder neck which is similar to the appearance seen in November 2017. No sonographic evidence of acute cholecystitis. Normal common bile duct. Increased hepatic echotexture compatible with fatty infiltrative change. Electronically Signed   By: David  [place] M.D.   On: 04/12/2016 08:24    EKG: Orders placed or performed during the hospital encounter of 04/12/16  . ED EKG within 10 minutes  . ED EKG within 10 minutes  . EKG 12-Lead  . EKG 12-Lead    IMPRESSION AND PLAN:  * Intractable epigastric pain   CT scan of abdomen and pelvis is negative except for some gallbladder stone but it is incidental finding and does not seem to be the cause of his pain.   Lipase is negative, patient is nonalcoholic. He does not take over-the-counter pain medicines, but he had complains of acid reflux.   Give IV morphine and IV Protonix twice a day.   I spoke to GI on call, he agreed to do endoscopy likely today.  * Hypertension   Continue home medications.  * Depression   Continue home medicines.  * Sleep apnea   Continue monitoring.   All the records are reviewed and case discussed with ED provider. Management plans discussed with the patient, family and they are in agreement.  CODE STATUS: Full code. Code Status History    This patient does not have a recorded code status. Please follow your organizational policy for patients in this situation.      TOTAL TIME TAKING CARE OF THIS PATIENT: 50 minutes.  Altamese Dilling M.D on 04/12/2016   Between 7am to 6pm - Pager - [phone]  After 6pm go to [email or web address] - password [email or web address]  [facility]  Office  [phone]  CC: Primary care physician; Baruch Gouty, MD   Note: This dictation was prepared with Dragon dictation along with smaller phrase technology. Any transcriptional errors that result from this process are unintentional.

## 2016-04-12 NOTE — ED Notes (Signed)
Pt prefers to sit up on side of the bed instead of lying back on the stretcher. Pt advised of fall risk with pain meds given. Family at bedside.

## 2016-04-12 NOTE — Progress Notes (Signed)
Dr.pabon called- He is ordering HIDA scan to finish work up. Requested to switch to Lovenox, , if he may need to go for any surgery.

## 2016-04-12 NOTE — ED Notes (Signed)
Pt states he can take morphine without reaction.

## 2016-04-12 NOTE — ED Notes (Signed)
Pt still rates chest pain 10/10. Will notify EDP when available.

## 2016-04-12 NOTE — ED Notes (Signed)
Pt denies change in chest pain, another nitroglycerin will be adm.

## 2016-04-12 NOTE — Anesthesia Post-op Follow-up Note (Cosign Needed)
Anesthesia QCDR form completed.        

## 2016-04-12 NOTE — ED Notes (Signed)
MD notified of pt's chest pain, see MAR for follow up.

## 2016-04-12 NOTE — Op Note (Signed)
[facility] Patient Name: Murriel Eidem Procedure Date: 04/12/2016 2:53 PM MRN: [id number] Account #: [id number] Date of Birth: 29-Jul-1975 Admit Type: Outpatient Age: 41 Room: [facility] ENDO ROOM 3 Gender: Male Note Status: Finalized Procedure:            Upper GI endoscopy Indications:          Epigastric abdominal pain Providers:            Midge Minium MD, MD Referring MD:         No Local Md, MD (Referring MD) Medicines:            Propofol per Anesthesia Complications:        No immediate complications. Procedure:            Pre-Anesthesia Assessment:                       - Prior to the procedure, a History and Physical was                        performed, and patient medications and allergies were                        reviewed. The patient's tolerance of previous                        anesthesia was also reviewed. The risks and benefits of                        the procedure and the sedation options and risks were                        discussed with the patient. All questions were                        answered, and informed consent was obtained. Prior                        Anticoagulants: The patient has taken Xarelto                        (rivaroxaban), last dose was 1 day prior to procedure.                        ASA Grade Assessment: II - A patient with mild systemic                        disease. After reviewing the risks and benefits, the                        patient was deemed in satisfactory condition to undergo                        the procedure.                       After obtaining informed consent, the endoscope was                        passed under direct vision. Throughout the procedure,  the patient's blood pressure, pulse, and oxygen                        saturations were monitored continuously. The Endoscope                        was introduced through the mouth, and advanced to the                   second part of duodenum. The upper GI endoscopy was                        accomplished without difficulty. The patient tolerated                        the procedure well. Findings:      The Z-line was irregular and was found at the gastroesophageal junction.      A small hiatal hernia was present.      The stomach was normal.      The examined duodenum was normal. Impression:           - Z-line irregular, at the gastroesophageal junction.                       - Small hiatal hernia.                       - Normal stomach.                       - Normal examined duodenum.                       - No specimens collected. Recommendation:       - Return patient to hospital ward for ongoing care.                       - Resume previous diet.                       - Continue present medications. Procedure Code(s):    --- Professional ---                       [phone], Esophagogastroduodenoscopy, flexible, transoral;                        diagnostic, including collection of specimen(s) by                        brushing or washing, when performed (separate procedure) Diagnosis Code(s):    --- Professional ---                       R10.13, Epigastric pain                       K22.8, Other specified diseases of esophagus CPT copyright 2016 American [employer] Association. All rights reserved. The codes documented in this report are preliminary and upon coder review may  be revised to meet current compliance requirements. Midge Miniumarren Londa Mackowski MD, MD 04/12/2016 3:02:23 PM This report has been signed electronically. Number of Addenda: 0 Note Initiated On: 04/12/2016 2:53 PM      [facility]

## 2016-04-12 NOTE — ED Notes (Signed)
Pt states he is currently taking Xarelto from hx of blood clots.

## 2016-04-12 NOTE — ED Notes (Signed)
Pt was consulted by surgeon.

## 2016-04-12 NOTE — ED Provider Notes (Signed)
[facility] Emergency Department Provider Note   ____________________________________________    I have reviewed the triage vital signs and the nursing notes.   HISTORY  Chief Complaint Abdominal pain    HPI Jack Sullivan is a 41 y.o. male who presents with epigastric pain. Patient complains of severe abdominal pain that started at approximately midnight. The pain has been unrelenting. He has not taken anything for it. He reports the pain is sharp and bandlike across his upper abdomen. He has had this once before and blames it on gallstones. No history of pancreatitis. He does have a history of PEs and reports compliance with Xarelto. No shortness of breath or pleurisy   Past Medical History:  Diagnosis Date  . Chronic pain   . Depression   . GERD (gastroesophageal reflux disease)   . Hypertension   . Morbid obesity (HCC)   . OSA (obstructive sleep apnea)    sleep study done 05/2012  . PE (pulmonary embolism) Nov 2013   bilateral following R leg injury  . 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 Peripheral neuropathy (HCC)   . Personal history of pulmonary embolism 12/28/2011   bilateral following R leg injury     Patient Active Problem List   Diagnosis Date Noted  . Insomnia 07/17/2015  . Moderate major depression, single episode (HCC) 07/15/2015  . Fatigue 05/02/2015  . Morbid obesity (HCC)   . GERD (gastroesophageal reflux disease)   . Hypertension   . Peripheral neuropathy (HCC)   . Chronic pain   . OSA (obstructive sleep apnea)   . Personal history of pulmonary embolism 12/28/2011    Past Surgical History:  Procedure Laterality Date  . CARDIOVASCULAR STRESS TEST  11/2012   normal  . HERNIA REPAIR      Prior to Admission medications   Medication Sig Start Date End Date Taking? Authorizing Provider  amLODipine (NORVASC) 5 MG tablet Take 1 tablet (5 mg total) by mouth daily. 02/03/16   Kerman Passey, MD    famotidine (PEPCID) 40 MG tablet Take 1 tablet (40 mg total) by mouth every evening. 12/29/15 12/28/16  Jeanmarie Plant, MD  lisinopril (PRINIVIL,ZESTRIL) 20 MG tablet Take 1 tablet (20 mg total) by mouth daily. 02/03/16   Kerman Passey, MD  omeprazole (PRILOSEC) 20 MG capsule Take 1 capsule (20 mg total) by mouth 2 (two) times daily before a meal. If needed; long-term use carries risk; talk to your doctor 02/03/16   Kerman Passey, MD  ondansetron (ZOFRAN) 4 MG tablet Take 1 tablet (4 mg total) by mouth every 8 (eight) hours as needed for nausea or vomiting. 12/29/15   Jeanmarie Plant, MD  traZODone (DESYREL) 50 MG tablet Take 1-2 tablets (50-100 mg total) by mouth at bedtime as needed for sleep. 08/12/15   Kerman Passey, MD  venlafaxine XR (EFFEXOR-XR) 150 MG 24 hr capsule Take 1 capsule (150 mg total) by mouth daily with breakfast. (this replaces Cymbalta / duloxetine) 08/18/15   Kerman Passey, MD  XARELTO 20 MG TABS tablet TAKE 1 TABLET BY MOUTH ONCE DAILY WITH SUPPER 02/03/16   Kerman Passey, MD     Allergies Vicodin [hydrocodone-acetaminophen]  Family History  Problem Relation Age of Onset  . Heart disease Father   . Hypertension Father   . Diabetes Paternal Grandfather   . Heart disease Paternal Grandfather   . Hypertension Paternal Grandfather   . Cancer Neg  Hx   . COPD Neg Hx   . Stroke Neg Hx     Social History Social History  Substance Use Topics  . Smoking status: Former Smoker    Packs/day: 1.00    Years: 20.00    Types: Cigarettes    Quit date: 02/27/2008  . Smokeless tobacco: Never Used  . Alcohol use No    Review of Systems  Constitutional: No fever/chills Eyes: No visual changes.   Cardiovascular: Denies chest pain. Respiratory: Denies shortness of breath. Gastrointestinal:As above  Musculoskeletal: Negative for back pain. Skin: Negative for rash. Neurological: Negative for headaches  10-point ROS otherwise  negative.  ____________________________________________   PHYSICAL EXAM:  VITAL SIGNS: ED Triage Vitals  Enc Vitals Group     BP 04/12/16 0543 (!) 150/85     Pulse Rate 04/12/16 0543 84     Resp 04/12/16 0543 17     Temp 04/12/16 0543 98 F (36.7 C)     Temp Source 04/12/16 0543 Oral     SpO2 04/12/16 0543 99 %     Weight 04/12/16 0544 (!) 320 lb (145.2 kg)     Height 04/12/16 0544 5\' 8"  (1.727 m)     Head Circumference --      Peak Flow --      Pain Score 04/12/16 0640 10     Pain Loc --      Pain Edu? --      Excl. in GC? --     Constitutional: Alert and oriented.  Pleasant and interactive Eyes: Conjunctivae are normal.  Head: Atraumatic. Nose: No congestion/rhinnorhea. Mouth/Throat: Mucous membranes are moist.    Cardiovascular: Normal rate, regular rhythm. Grossly normal heart sounds.  Good peripheral circulation. Respiratory: Normal respiratory effort.  No retractions. Lungs CTAB. Gastrointestinal: Soft and nontender. No distention.  No CVA tenderness. Genitourinary: deferred Musculoskeletal: No lower extremity tenderness nor edema.  Warm and well perfused Neurologic:  Normal speech and language. No gross focal neurologic deficits are appreciated.  Skin:  Skin is warm, dry and intact. No rash noted. Psychiatric: Mood and affect are normal. Speech and behavior are normal.  ____________________________________________   LABS (all labs ordered are listed, but only abnormal results are displayed)  Labs Reviewed  BASIC METABOLIC PANEL - Abnormal; Notable for the following:       Result Value   Glucose, Bld 118 (*)    BUN 24 (*)    All other components within normal limits  HEPATIC FUNCTION PANEL - Abnormal; Notable for the following:    Bilirubin, Direct <0.1 (*)    All other components within normal limits  CBC  TROPONIN I  LIPASE, BLOOD   ____________________________________________  EKG  ED ECG REPORT I, Jene Every, the attending physician,  personally viewed and interpreted this ECG.  Date: 04/12/2016  Rate: 84 Rhythm: normal sinus rhythm QRS Axis: normal Intervals: normal ST/T Wave abnormalities: normal Conduction Disturbances: none Narrative Interpretation: No significant findings  ____________________________________________  RADIOLOGY  Chest x-ray normal US shows gallstones but no evidence of cholecystitis CT scan unremarkable ____________________________________________   PROCEDURES  Procedure(s) performed: No    Critical Care performed: No ____________________________________________   INITIAL IMPRESSION / ASSESSMENT AND PLAN / ED COURSE  Pertinent labs & imaging results that were available during my care of the patient were reviewed by me and considered in my medical decision making (see chart for details).  Patient presents with epigastric pain, not chest pain. He has no shortness of breath or pleurisy. This  is similar to what he had in November of last year. His lab work is overall reassuring. He had minimal relief from IV morphine so second dose was given. Ultrasound shows gallstones and I discussed with surgery. Dr. Everlene FarrierPabon saw the patient in the ED and Does Not Feel This Is a Surgical Issue.  CT chest/abd/pelvis unremarkable. Continues to be in significant pain despite multiple doses of IV pain medication.   Will admit to medicine for further evaluation    ____________________________________________   FINAL CLINICAL IMPRESSION(S) / ED DIAGNOSES  Final diagnoses:  Epigastric pain      NEW MEDICATIONS STARTED DURING THIS VISIT:  New Prescriptions   No medications on file     Note:  This document was prepared using Dragon voice recognition software and may include unintentional dictation errors.    Jene Everyobert Bedelia Pong, MD 04/12/16 1218

## 2016-04-12 NOTE — ED Notes (Signed)
Patient transported to Ultrasound 

## 2016-04-12 NOTE — Consult Note (Signed)
Midge Minium, MD [facility]  [address] [place], [place] [address] Phone: [phone] Fax : [phone]  Consultation  Referring Provider:     Dr. Elisabeth Pigeon Primary Care Physician:  Baruch Gouty, MD Primary Gastroenterologist:  None         Reason for Consultation:     Epigastric pain  Date of Admission:  04/12/2016 Date of Consultation:  04/12/2016         HPI:   Jack Sullivan is a 41 y.o. male with a history of pulmonary emboli and on Xarelto. The patient states that he had significant epigastric and lower chest pain across the entire chest and going to the back that started last night. The patient also developed some nausea without vomiting. He states he has not eaten anything since last night and he was given some pain medication that helped him in the ER but otherwise has nothing that he can report makes his symptoms any better or worse. The patient also denies that moving around makes his symptoms any better but when he lays down flat the symptoms are worse. The patient had imaging that showed his gallbladder wall 3 normal with gallstones seen. The patient denies taking any Advil Aleve or Motrin.  Past Medical History:  Diagnosis Date  . Chronic pain   . Depression   . GERD (gastroesophageal reflux disease)   . Hypertension   . Morbid obesity (HCC)   . OSA (obstructive sleep apnea)    sleep study done 05/2012  . PE (pulmonary embolism) Nov 2013   bilateral following R leg injury  . 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 Peripheral neuropathy (HCC)   . Personal history of pulmonary embolism 12/28/2011   bilateral following R leg injury     Past Surgical History:  Procedure Laterality Date  . CARDIOVASCULAR STRESS TEST  11/2012   normal  . HERNIA REPAIR      Prior to Admission medications   Medication Sig Start Date End Date Taking? Authorizing Provider  amLODipine (NORVASC) 5 MG tablet Take 1 tablet (5 mg total) by mouth daily. 02/03/16  Yes  Kerman Passey, MD  lisinopril (PRINIVIL,ZESTRIL) 20 MG tablet Take 1 tablet (20 mg total) by mouth daily. 02/03/16  Yes Kerman Passey, MD  omeprazole (PRILOSEC) 20 MG capsule Take 1 capsule (20 mg total) by mouth 2 (two) times daily before a meal. If needed; long-term use carries risk; talk to your doctor Patient taking differently: Take 40 mg by mouth daily. If needed; long-term use carries risk; talk to your doctor 02/03/16  Yes Kerman Passey, MD  traZODone (DESYREL) 50 MG tablet Take 1-2 tablets (50-100 mg total) by mouth at bedtime as needed for sleep. 08/12/15  Yes Kerman Passey, MD  XARELTO 20 MG TABS tablet TAKE 1 TABLET BY MOUTH ONCE DAILY WITH SUPPER 02/03/16  Yes Kerman Passey, MD  famotidine (PEPCID) 40 MG tablet Take 1 tablet (40 mg total) by mouth every evening. Patient not taking: Reported on 04/12/2016 12/29/15 12/28/16  Jeanmarie Plant, MD  ondansetron (ZOFRAN) 4 MG tablet Take 1 tablet (4 mg total) by mouth every 8 (eight) hours as needed for nausea or vomiting. 12/29/15   Jeanmarie Plant, MD  venlafaxine XR (EFFEXOR-XR) 150 MG 24 hr capsule Take 1 capsule (150 mg total) by mouth daily with breakfast. (this replaces Cymbalta / duloxetine) 08/18/15   Kerman Passey, MD    Family History  Problem  Relation Age of Onset  . Heart disease Father   . Hypertension Father   . Lung cancer Mother   . Diabetes Paternal Grandfather   . Heart disease Paternal Grandfather   . Hypertension Paternal Grandfather   . Cancer Neg Hx   . COPD Neg Hx   . Stroke Neg Hx      Social History  Substance Use Topics  . Smoking status: Former Smoker    Packs/day: 1.00    Years: 20.00    Types: Cigarettes    Quit date: 02/27/2008  . Smokeless tobacco: Never Used  . Alcohol use No    Allergies as of 04/12/2016 - Review Complete 04/12/2016  Allergen Reaction Noted  . Vicodin [hydrocodone-acetaminophen] Other (See Comments) 09/11/2015    Review of Systems:    All systems reviewed and negative  except where noted in HPI.   Physical Exam:  Vital signs in last 24 hours: Temp:  [97.4 F (36.3 C)-98.4 F (36.9 C)] 97.4 F (36.3 C) (02/15 1510) Pulse Rate:  [61-88] 71 (02/15 1510) Resp:  [12-23] 12 (02/15 1510) BP: (105-150)/(54-86) 127/76 (02/15 1510) SpO2:  [94 %-99 %] 99 % (02/15 1510) Weight:  [320 lb (145.2 kg)] 320 lb (145.2 kg) (02/15 1436)   General:   Pleasant, cooperative in NAD, morbidly obese Head:  Normocephalic and atraumatic. Eyes:   No icterus.   Conjunctiva pink. PERRLA. Ears:  Normal auditory acuity. Neck:  Supple; no masses or thyroidomegaly Lungs: Respirations even and unlabored. Lungs clear to auscultation bilaterally.   No wheezes, crackles, or rhonchi.  Heart:  Regular rate and rhythm;  Without murmur, clicks, rubs or gallops Abdomen:  Soft, nondistended, nontender. Normal bowel sounds. No appreciable masses or hepatomegaly.  No rebound or guarding.  Rectal:  Not performed. Msk:  Symmetrical without gross deformities.    Extremities:  Without edema, cyanosis or clubbing. Neurologic:  Alert and oriented x3;  grossly normal neurologically. Skin:  Intact without significant lesions or rashes. Cervical Nodes:  No significant cervical adenopathy. Psych:  Alert and cooperative. Normal affect.  LAB RESULTS:  Recent Labs  04/12/16 0547  WBC 9.1  HGB 14.5  HCT 44.2  PLT 232   BMET  Recent Labs  04/12/16 0547  NA 142  K 4.6  CL 106  CO2 29  GLUCOSE 118*  BUN 24*  CREATININE 0.98  CALCIUM 9.2   LFT  Recent Labs  04/12/16 0547  PROT 7.7  ALBUMIN 4.1  AST 28  ALT 31  ALKPHOS 86  BILITOT 0.7  BILIDIR <0.1*  IBILI NOT CALCULATED   PT/INR  Recent Labs  04/12/16 0843  LABPROT 19.1*  INR 1.59    STUDIES: Dg Chest 2 View  Result Date: 04/12/2016 CLINICAL DATA:  Sharp chest pain since midnight. History of blood clots in the lungs. EXAM: CHEST  2 VIEW COMPARISON:  12/29/2015 FINDINGS: Slightly shallow inspiration. Heart size and  pulmonary vascularity are normal. No focal airspace disease or consolidation in the lungs. Blunting of the right costophrenic angle is unchanged since prior study and may represent pleural thickening. IMPRESSION: No active cardiopulmonary disease. Electronically Signed   By: Burman Nieves M.D.   On: 04/12/2016 06:25   Ct Angio Chest Pe W Or Wo Contrast  Result Date: 04/12/2016 CLINICAL DATA:  Epigastric pain radiating to back. EXAM: CT ANGIOGRAPHY CHEST CT ABDOMEN AND PELVIS WITH CONTRAST TECHNIQUE: Multidetector CT imaging of the chest was performed using the standard protocol during bolus administration of intravenous contrast. Multiplanar  CT image reconstructions and MIPs were obtained to evaluate the vascular anatomy. Multidetector CT imaging of the abdomen and pelvis was performed using the standard protocol during bolus administration of intravenous contrast. CONTRAST:  100 cc Isovue 370 IV COMPARISON:  Chest CT 03/01/2013.  CT abdomen and pelvis 09/11/2015. FINDINGS: CTA CHEST FINDINGS Cardiovascular: No filling defects in the pulmonary arteries to suggest pulmonary emboli. Heart and mediastinal contours are within normal limits. No focal opacities or effusions. No acute bony abnormality. Mediastinum/Nodes: No mediastinal, hilar, or axillary adenopathy. Lungs/Pleura: Lungs are clear. No focal airspace opacities or suspicious nodules. No effusions. Musculoskeletal: No acute bony abnormality or focal bone lesion. Review of the MIP images confirms the above findings. CT ABDOMEN and PELVIS FINDINGS Hepatobiliary: Numerous gallstones within the gallbladder. No focal hepatic abnormality. No biliary ductal dilatation. Pancreas: No focal abnormality or ductal dilatation. Spleen: No focal abnormality.  Normal size. Adrenals/Urinary Tract: No adrenal abnormality. No focal renal abnormality. No stones or hydronephrosis. Urinary bladder is unremarkable. Stomach/Bowel: Stomach, large and small bowel grossly  unremarkable. Vascular/Lymphatic: No evidence of aneurysm or adenopathy. Reproductive: No visible focal abnormality. Other: No free fluid or free air. Musculoskeletal: No acute bony abnormality or focal bone lesion. Review of the MIP images confirms the above findings. IMPRESSION: No evidence of pulmonary embolus.  No acute cardiopulmonary disease. Cholelithiasis. No acute findings in the abdomen or pelvis. Electronically Signed   By: Charlett Nose M.D.   On: 04/12/2016 11:10   Ct Abdomen Pelvis W Contrast  Result Date: 04/12/2016 CLINICAL DATA:  Epigastric pain radiating to back. EXAM: CT ANGIOGRAPHY CHEST CT ABDOMEN AND PELVIS WITH CONTRAST TECHNIQUE: Multidetector CT imaging of the chest was performed using the standard protocol during bolus administration of intravenous contrast. Multiplanar CT image reconstructions and MIPs were obtained to evaluate the vascular anatomy. Multidetector CT imaging of the abdomen and pelvis was performed using the standard protocol during bolus administration of intravenous contrast. CONTRAST:  100 cc Isovue 370 IV COMPARISON:  Chest CT 03/01/2013.  CT abdomen and pelvis 09/11/2015. FINDINGS: CTA CHEST FINDINGS Cardiovascular: No filling defects in the pulmonary arteries to suggest pulmonary emboli. Heart and mediastinal contours are within normal limits. No focal opacities or effusions. No acute bony abnormality. Mediastinum/Nodes: No mediastinal, hilar, or axillary adenopathy. Lungs/Pleura: Lungs are clear. No focal airspace opacities or suspicious nodules. No effusions. Musculoskeletal: No acute bony abnormality or focal bone lesion. Review of the MIP images confirms the above findings. CT ABDOMEN and PELVIS FINDINGS Hepatobiliary: Numerous gallstones within the gallbladder. No focal hepatic abnormality. No biliary ductal dilatation. Pancreas: No focal abnormality or ductal dilatation. Spleen: No focal abnormality.  Normal size. Adrenals/Urinary Tract: No adrenal  abnormality. No focal renal abnormality. No stones or hydronephrosis. Urinary bladder is unremarkable. Stomach/Bowel: Stomach, large and small bowel grossly unremarkable. Vascular/Lymphatic: No evidence of aneurysm or adenopathy. Reproductive: No visible focal abnormality. Other: No free fluid or free air. Musculoskeletal: No acute bony abnormality or focal bone lesion. Review of the MIP images confirms the above findings. IMPRESSION: No evidence of pulmonary embolus.  No acute cardiopulmonary disease. Cholelithiasis. No acute findings in the abdomen or pelvis. Electronically Signed   By: Charlett Nose M.D.   On: 04/12/2016 11:10   US Abdomen Limited Ruq  Result Date: 04/12/2016 CLINICAL DATA:  6 hours of abdominal pain associated with vomiting. History of previous umbilical hernia repair. Known gallstones. EXAM: US ABDOMEN LIMITED - RIGHT UPPER QUADRANT COMPARISON:  Right upper quadrant abdominal ultrasound of December 29, 2015 FINDINGS:  Gallbladder: The gallbladder is adequately distended. There are multiple echogenic mobile shadowing stones. A non mobile stone is demonstrated in the neck which measures 16 mm in diameter. The gallbladder wall is top normal at 3.3 mm. There is no pericholecystic fluid or positive sonographic Murphy's sign. Common bile duct: Diameter: 5.1 mm Liver: The hepatic echotexture is mildly increased diffusely. There is no focal mass nor ductal dilation. The surface contour of the liver is normal. IMPRESSION: Multiple gallstones. One stone appears impacted in the gallbladder neck which is similar to the appearance seen in November 2017. No sonographic evidence of acute cholecystitis. Normal common bile duct. Increased hepatic echotexture compatible with fatty infiltrative change. Electronically Signed   By: David  Swazil[place]Jordan M.D.   On: 04/12/2016 08:24      Impression / Plan:   Jack Sullivan is a 41 y.o. y/o male with epigastric/chest pain that is not reproducible with palpation. The  patient underwent an EGD  today that showed a small hiatal hernia with some signs of previous reflux but no acute changes. There are no GI causes for this patient's symptoms and I would suggest looking for another cause for the patient's significant pain. The patient and the patient's family have been explained the findings and my impression.  Thank you for involving me in the care of this patient.      LOS: 0 days   Midge Miniumarren Paizlee Kinder, MD  04/12/2016, 3:19 PM   Note: This dictation was prepared with Dragon dictation along with smaller phrase technology. Any transcriptional errors that result from this process are unintentional.

## 2016-04-12 NOTE — Transfer of Care (Signed)
Immediate Anesthesia Transfer of Care Note  Patient: Jack Sullivan  Procedure(s) Performed: Procedure(s): ESOPHAGOGASTRODUODENOSCOPY (EGD) WITH PROPOFOL (N/A)  Patient Location: PACU  Anesthesia Type:General  Level of Consciousness: sedated  Airway & Oxygen Therapy: Patient Spontanous Breathing and Patient connected to nasal cannula oxygen  Post-op Assessment: Report given to RN and Post -op Vital signs reviewed and stable  Post vital signs: Reviewed and stable  Last Vitals:  Vitals:   04/12/16 1436 04/12/16 1510  BP: 118/83 127/76  Pulse: 75 71  Resp: 20 12  Temp: 36.9 C 36.3 C    Last Pain:  Vitals:   04/12/16 1510  TempSrc: Tympanic  PainSc:          Complications: No apparent anesthesia complications

## 2016-04-12 NOTE — ED Notes (Signed)
Patient transported to CT 

## 2016-04-12 NOTE — ED Notes (Signed)
ED Provider at bedside. 

## 2016-04-12 NOTE — H&P (Signed)
Midge Minium, MD [facility] [address] [place], [place] [address] Phone: [phone] Fax : [phone]  Primary Care Physician:  Baruch Gouty, MD Primary Gastroenterologist:  Dr. Servando Snare  Pre-Procedure History & Physical: HPI:  Jack Sullivan is a 41 y.o. male is here for an endoscopy.   Past Medical History:  Diagnosis Date  . Chronic pain   . Depression   . GERD (gastroesophageal reflux disease)   . Hypertension   . Morbid obesity (HCC)   . OSA (obstructive sleep apnea)    sleep study done 05/2012  . PE (pulmonary embolism) Nov 2013   bilateral following R leg injury  . 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 Peripheral neuropathy (HCC)   . Personal history of pulmonary embolism 12/28/2011   bilateral following R leg injury     Past Surgical History:  Procedure Laterality Date  . CARDIOVASCULAR STRESS TEST  11/2012   normal  . HERNIA REPAIR      Prior to Admission medications   Medication Sig Start Date End Date Taking? Authorizing Provider  amLODipine (NORVASC) 5 MG tablet Take 1 tablet (5 mg total) by mouth daily. 02/03/16  Yes Kerman Passey, MD  lisinopril (PRINIVIL,ZESTRIL) 20 MG tablet Take 1 tablet (20 mg total) by mouth daily. 02/03/16  Yes Kerman Passey, MD  omeprazole (PRILOSEC) 20 MG capsule Take 1 capsule (20 mg total) by mouth 2 (two) times daily before a meal. If needed; long-term use carries risk; talk to your doctor Patient taking differently: Take 40 mg by mouth daily. If needed; long-term use carries risk; talk to your doctor 02/03/16  Yes Kerman Passey, MD  traZODone (DESYREL) 50 MG tablet Take 1-2 tablets (50-100 mg total) by mouth at bedtime as needed for sleep. 08/12/15  Yes Kerman Passey, MD  XARELTO 20 MG TABS tablet TAKE 1 TABLET BY MOUTH ONCE DAILY WITH SUPPER 02/03/16  Yes Kerman Passey, MD  famotidine (PEPCID) 40 MG tablet Take 1 tablet (40 mg total) by mouth every evening. 12/29/15 12/28/16  Jeanmarie Plant, MD    ondansetron (ZOFRAN) 4 MG tablet Take 1 tablet (4 mg total) by mouth every 8 (eight) hours as needed for nausea or vomiting. 12/29/15   Jeanmarie Plant, MD  venlafaxine XR (EFFEXOR-XR) 150 MG 24 hr capsule Take 1 capsule (150 mg total) by mouth daily with breakfast. (this replaces Cymbalta / duloxetine) 08/18/15   Kerman Passey, MD    Allergies as of 04/12/2016 - Review Complete 04/12/2016  Allergen Reaction Noted  . Vicodin [hydrocodone-acetaminophen] Other (See Comments) 09/11/2015    Family History  Problem Relation Age of Onset  . Heart disease Father   . Hypertension Father   . Lung cancer Mother   . Diabetes Paternal Grandfather   . Heart disease Paternal Grandfather   . Hypertension Paternal Grandfather   . Cancer Neg Hx   . COPD Neg Hx   . Stroke Neg Hx     Social History   Social History  . Marital status: Married    Spouse name: N/A  . Number of children: N/A  . Years of education: N/A   Occupational History  . Not on file.   Social History Main Topics  . Smoking status: Former Smoker    Packs/day: 1.00    Years: 20.00    Types: Cigarettes    Quit date: 02/27/2008  . Smokeless tobacco: Never Used  . Alcohol use No  .  Drug use: No  . Sexual activity: Not on file   Other Topics Concern  . Not on file   Social History Narrative  . No narrative on file    Review of Systems: See HPI, otherwise negative ROS  Physical Exam: BP 118/83   Pulse 75   Temp 98.4 F (36.9 C) (Tympanic)   Resp 20   Ht 5\' 8"  (1.727 m)   Wt (!) 320 lb (145.2 kg)   SpO2 99%   BMI 48.66 kg/m  General:   Alert,  pleasant and cooperative in NAD Head:  Normocephalic and atraumatic. Neck:  Supple; no masses or thyromegaly. Lungs:  Clear throughout to auscultation.    Heart:  Regular rate and rhythm. Abdomen:  Soft, nontender and nondistended. Normal bowel sounds, without guarding, and without rebound.   Neurologic:  Alert and  oriented x4;  grossly normal  neurologically.  Impression/Plan: Jack Sullivan is here for an endoscopy to be performed for epigastric pain  Risks, benefits, limitations, and alternatives regarding  endoscopy have been reviewed with the patient.  Questions have been answered.  All parties agreeable.   Midge Miniumarren Tessy Pawelski, MD  04/12/2016, 2:41 PM

## 2016-04-12 NOTE — Anesthesia Preprocedure Evaluation (Signed)
Anesthesia Evaluation  Patient identified by MRN, date of birth, ID band Patient awake    Reviewed: Allergy & Precautions, NPO status , Patient's Chart, lab work & pertinent test results, reviewed documented beta blocker date and time   Airway Mallampati: III  TM Distance: >3 FB     Dental  (+) Chipped   Pulmonary sleep apnea , former smoker,           Cardiovascular hypertension, Pt. on medications      Neuro/Psych PSYCHIATRIC DISORDERS Depression  Neuromuscular disease    GI/Hepatic GERD  ,  Endo/Other    Renal/GU      Musculoskeletal   Abdominal   Peds  Hematology   Anesthesia Other Findings   Reproductive/Obstetrics                             Anesthesia Physical Anesthesia Plan  ASA: III  Anesthesia Plan: General   Post-op Pain Management:    Induction: Intravenous  Airway Management Planned: Nasal Cannula  Additional Equipment:   Intra-op Plan:   Post-operative Plan:   Informed Consent: I have reviewed the patients History and Physical, chart, labs and discussed the procedure including the risks, benefits and alternatives for the proposed anesthesia with the patient or authorized representative who has indicated his/her understanding and acceptance.     Plan Discussed with: CRNA  Anesthesia Plan Comments:         Anesthesia Quick Evaluation

## 2016-04-12 NOTE — Consult Note (Signed)
Patient ID: Jack RileJoey A Sullivan, male   DOB: Nov 11, 1975, 41 y.o.   MRN: [id number]  HPI Jack Sullivan is a 41 y.o. male with significant history of orbital obesity and PE on Xarelto. Asked by Dr. Cyril LoosenKinner to see him regarding possible gallbladder disease. He reports that approximately 8 hours ago started having chest pain that radiated to the epigastric area and time. His chest pain is a pressure-type and a is severe. There is no specific alleviating or aggravating factors. Patient did develop some nausea but no vomiting. He had a history of some pain in the past in November and apparently it was determined that he had gallbladder disease. This time another ultrasound of the right upper quadrant was obtained which I personally reviewed showing evidence of gallstone with normal diameter of the gallbladder wall and normal common bile duct and no evidence of cholecystitis. Please note that the ultrasound is completely unchanged from the previous one he had 3 months ago. He took a Xarelto last night he does have some dyspnea on exertion as well as his sleep apnea. I have reviewed also previous records showing evidence of  pulmonary hypertension some right ventricular dysfunction. Apparently there has been some question about the compliance of this patient because he never follow-up with a surgeon regarding his gallbladder disease. Nml LFTs and WBC  HPI  Past Medical History:  Diagnosis Date  . Chronic pain   . Depression   . GERD (gastroesophageal reflux disease)   . Hypertension   . Morbid obesity (HCC)   . OSA (obstructive sleep apnea)    sleep study done 05/2012  . PE (pulmonary embolism) Nov 2013   bilateral following R leg injury  . 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 Peripheral neuropathy (HCC)   . Personal history of pulmonary embolism 12/28/2011   bilateral following R leg injury     Past Surgical History:  Procedure Laterality Date  . CARDIOVASCULAR STRESS TEST   11/2012   normal  . HERNIA REPAIR      Family History  Problem Relation Age of Onset  . Heart disease Father   . Hypertension Father   . Diabetes Paternal Grandfather   . Heart disease Paternal Grandfather   . Hypertension Paternal Grandfather   . Cancer Neg Hx   . COPD Neg Hx   . Stroke Neg Hx     Social History Social History  Substance Use Topics  . Smoking status: Former Smoker    Packs/day: 1.00    Years: 20.00    Types: Cigarettes    Quit date: 02/27/2008  . Smokeless tobacco: Never Used  . Alcohol use No    Allergies  Allergen Reactions  . Vicodin [Hydrocodone-Acetaminophen] Other (See Comments)    Nose bleed    Current Facility-Administered Medications  Medication Dose Route Frequency Provider Last Rate Last Dose  . nitroGLYCERIN (NITROSTAT) SL tablet 0.4 mg  0.4 mg Sublingual Q5 min PRN Jene Everyobert Kinner, MD   0.4 mg at 04/12/16 [id number]   Current Outpatient Prescriptions  Medication Sig Dispense Refill  . amLODipine (NORVASC) 5 MG tablet Take 1 tablet (5 mg total) by mouth daily. 90 tablet 1  . lisinopril (PRINIVIL,ZESTRIL) 20 MG tablet Take 1 tablet (20 mg total) by mouth daily. 90 tablet 1  . omeprazole (PRILOSEC) 20 MG capsule Take 1 capsule (20 mg total) by mouth 2 (two) times daily before a meal. If needed; long-term use carries risk; talk to your doctor (  Patient taking differently: Take 40 mg by mouth daily. If needed; long-term use carries risk; talk to your doctor) 60 capsule 0  . traZODone (DESYREL) 50 MG tablet Take 1-2 tablets (50-100 mg total) by mouth at bedtime as needed for sleep. 60 tablet 3  . XARELTO 20 MG TABS tablet TAKE 1 TABLET BY MOUTH ONCE DAILY WITH SUPPER 90 tablet 1  . famotidine (PEPCID) 40 MG tablet Take 1 tablet (40 mg total) by mouth every evening. 30 tablet 1  . ondansetron (ZOFRAN) 4 MG tablet Take 1 tablet (4 mg total) by mouth every 8 (eight) hours as needed for nausea or vomiting. 8 tablet 0  . venlafaxine XR (EFFEXOR-XR) 150 MG 24  hr capsule Take 1 capsule (150 mg total) by mouth daily with breakfast. (this replaces Cymbalta / duloxetine) 30 capsule 2     Review of Systems A 10 point review of systems was asked and was negative except for the information on the HPI  Physical Exam Blood pressure 110/69, pulse 81, temperature 98 F (36.7 C), temperature source Oral, resp. rate 18, height 5\' 8"  (1.727 m), weight (!) 145.2 kg (320 lb), SpO2 96 %. CONSTITUTIONAL: Super morbidly obese male in no acute distress EYES: Pupils are equal, round, and reactive to light, Sclera are non-icteric. EARS, NOSE, MOUTH AND THROAT: The oropharynx is clear. The oral mucosa is pink and moist. Hearing is intact to voice. LYMPH NODES:  Lymph nodes in the neck are normal. RESPIRATORY:  Lungs are clear. There is normal respiratory effort, with equal breath sounds bilaterally, and without pathologic use of accessory muscles. CARDIOVASCULAR: Heart is regular without murmurs, gallops, or rubs. GI: The abdomen is  soft,  and nondistended. There are no palpable masses. There is no hepatosplenomegaly. There are normal bowel sounds in all quadrants. Negative murphy sign. GU: Rectal deferred.   MUSCULOSKELETAL: Normal muscle strength and tone. No cyanosis or edema.   SKIN: Turgor is good and there are no pathologic skin lesions or ulcers. NEUROLOGIC: Motor and sensation is grossly normal. Cranial nerves are grossly intact. PSYCH:  Oriented to person, place and time. Affect is normal.  Data Reviewed  I have personally reviewed the patient's imaging, laboratory findings and medical records.    Assessment/Plan   atypical chest pain and possible some epigastric pain. Obviously differential is very broad and an acute ischemic event as well as a worsening P should be in the differential. Gallbladder can certainly create some of the symptoms but his symptomatology is not classic for biliary disease more over his LFTs are normal and his ultrasound is  unchanged from 3 months ago. That makes cholecystitis less likely. Although definitive way would be to perform a HIDA scan. At this point clinically he does not have any Murphy sign and does not have any signs of cholecystitis. I am I will be more concerned from his medical perspective and I eat cardiac and pulmonary status as the source of his pain. At this point I do not think it is prudent to perform any invasive interventions regarding his gallbladder I will encourage the ER team to work him up from a cardiac or pulmonary perspective. Obviously no need for any emergency intervention at this time. Usually at high risk of perioperative morbidity and mortality given his super morbidly obesity, PT and anticoagulation status. I discussed this with the patient and the deal circumstances he needs to lose weight, be optimized from a pulmonary and cardiac perspective and then do an elective cholecystectomy. Discussed  with the patient in detail and he understands   Sterling Big, MD [facility] General Surgeon 04/12/2016, 9:13 AM

## 2016-04-12 NOTE — ED Triage Notes (Addendum)
Pt to triage in wheelchair due to chest pain. Pt c/o left sided chest pain that radiates into back, starting at 0000. Pt has nausea, no vomiting or SOB.

## 2016-04-13 ENCOUNTER — Encounter: Payer: Self-pay | Admitting: Gastroenterology

## 2016-04-13 ENCOUNTER — Observation Stay: Payer: Self-pay

## 2016-04-13 LAB — BASIC METABOLIC PANEL
Anion gap: 5 (ref 5–15)
BUN: 21 mg/dL — AB (ref 6–20)
CHLORIDE: 105 mmol/L (ref 101–111)
CO2: 30 mmol/L (ref 22–32)
CREATININE: 0.9 mg/dL (ref 0.61–1.24)
Calcium: 8.2 mg/dL — ABNORMAL LOW (ref 8.9–10.3)
GFR calc Af Amer: >60 mL/min (ref >60)
GFR calc non Af Amer: >60 mL/min (ref >60)
Glucose, Bld: 94 mg/dL (ref 65–99)
POTASSIUM: 4 mmol/L (ref 3.5–5.1)
Sodium: 140 mmol/L (ref 135–145)

## 2016-04-13 LAB — CBC
HEMATOCRIT: 38.7 % — AB (ref 40.0–52.0)
Hemoglobin: 13.2 g/dL (ref 13.0–18.0)
MCH: 29 pg (ref 26.0–34.0)
MCHC: 34.3 g/dL (ref 32.0–36.0)
MCV: 84.6 fL (ref 80.0–100.0)
PLATELETS: 208 10*3/uL (ref 150–440)
RBC: 4.57 MIL/uL (ref 4.40–5.90)
RDW: 14 % (ref 11.5–14.5)
WBC: 7.6 10*3/uL (ref 3.8–10.6)

## 2016-04-13 MED ORDER — PIPERACILLIN-TAZOBACTAM 4.5 G IVPB
4.5000 g | Freq: Three times a day (TID) | INTRAVENOUS | Status: DC
  Administered 2016-04-13 – 2016-04-14 (×2): 4.5 g via INTRAVENOUS
  Filled 2016-04-13 (×4): qty 100

## 2016-04-13 MED ORDER — TECHNETIUM TC 99M MEBROFENIN IV KIT
5.0000 | PACK | Freq: Once | INTRAVENOUS | Status: AC | PRN
  Administered 2016-04-13: 4.95 via INTRAVENOUS

## 2016-04-13 MED ORDER — PIPERACILLIN-TAZOBACTAM 3.375 G IVPB 30 MIN: 3.3750 g | Freq: Three times a day (TID) | INTRAVENOUS | Status: DC

## 2016-04-13 NOTE — Progress Notes (Signed)
ANTIBIOTIC CONSULT NOTE - INITIAL  Pharmacy Consult for Zosyn  Indication: intra abdominal infection  Allergies  Allergen Reactions  . Vicodin [Hydrocodone-Acetaminophen] Other (See Comments)    Nose bleed    Patient Measurements: Height: 5\' 8"  (172.7 cm) Weight: (!) 327 lb 6.4 oz (148.5 kg) IBW/kg (Calculated) : 68.4 Adjusted Body Weight:   Vital Signs: Temp: 98.3 F (36.8 C) (02/16 1322) Temp Source: Oral (02/16 1322) BP: 112/61 (02/16 1322) Pulse Rate: 69 (02/16 1322) Intake/Output from previous day: 02/15 0701 - 02/16 0700 In: 1959 [P.O.:780; I.V.:1179] Out: 0  Intake/Output from this shift: Total I/O In: 507.5 [I.V.:507.5] Out: -   Labs:  Recent Labs  04/12/16 0547 04/13/16 0550  WBC 9.1 7.6  HGB 14.5 13.2  PLT 232 208  CREATININE 0.98 0.90   Estimated Creatinine Clearance: 153.4 mL/min (by C-G formula based on SCr of 0.9 mg/dL). No results for input(s): VANCOTROUGH, VANCOPEAK, VANCORANDOM, GENTTROUGH, GENTPEAK, GENTRANDOM, TOBRATROUGH, TOBRAPEAK, TOBRARND, AMIKACINPEAK, AMIKACINTROU, AMIKACIN in the last 72 hours.   Microbiology: No results found for this or any previous visit (from the past 720 hour(s)).  Medical History: Past Medical History:  Diagnosis Date  . Chronic pain   . Depression   . GERD (gastroesophageal reflux disease)   . Hypertension   . Morbid obesity (HCC)   . OSA (obstructive sleep apnea)    sleep study done 05/2012  . PE (pulmonary embolism) Nov 2013   bilateral following R leg injury  . 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 Peripheral neuropathy (HCC)   . Personal history of pulmonary embolism 12/28/2011   bilateral following R leg injury     Medications:  Prescriptions Prior to Admission  Medication Sig Dispense Refill Last Dose  . amLODipine (NORVASC) 5 MG tablet Take 1 tablet (5 mg total) by mouth daily. 90 tablet 1 04/12/2016 at 0500  . lisinopril (PRINIVIL,ZESTRIL) 20 MG tablet Take 1 tablet  (20 mg total) by mouth daily. 90 tablet 1 04/12/2016 at 0500  . omeprazole (PRILOSEC) 20 MG capsule Take 1 capsule (20 mg total) by mouth 2 (two) times daily before a meal. If needed; long-term use carries risk; talk to your doctor (Patient taking differently: Take 40 mg by mouth daily. If needed; long-term use carries risk; talk to your doctor) 60 capsule 0 04/12/2016 at 0500  . traZODone (DESYREL) 50 MG tablet Take 1-2 tablets (50-100 mg total) by mouth at bedtime as needed for sleep. 60 tablet 3 Past Week at prn  . XARELTO 20 MG TABS tablet TAKE 1 TABLET BY MOUTH ONCE DAILY WITH SUPPER 90 tablet 1 04/12/2016 at 0500  . famotidine (PEPCID) 40 MG tablet Take 1 tablet (40 mg total) by mouth every evening. (Patient not taking: Reported on 04/12/2016) 30 tablet 1 Not Taking at Unknown time  . ondansetron (ZOFRAN) 4 MG tablet Take 1 tablet (4 mg total) by mouth every 8 (eight) hours as needed for nausea or vomiting. 8 tablet 0   . venlafaxine XR (EFFEXOR-XR) 150 MG 24 hr capsule Take 1 capsule (150 mg total) by mouth daily with breakfast. (this replaces Cymbalta / duloxetine) 30 capsule 2 09/11/2015 at Unknown time   Assessment: CrCl = 153.4 ml/min TBW = 148.5 kg   Goal of Therapy:  Vancomycin trough level 15-20 mcg/ml  Plan:  Expected duration 7 days with resolution of temperature and/or normalization of WBC   Zosyn 3.375 gm IV Q8H originally ordered.  Will adjust dose to Zosyn 4.5  gm IV Q8H EI to start 2/16 @ 22:00.   Paydon Carll D 04/13/2016,6:33 PM

## 2016-04-13 NOTE — Anesthesia Postprocedure Evaluation (Signed)
Anesthesia Post Note  Patient: Zenon A Degroote  Procedure(s) Performed: Procedure(s) (LRB): ESOPHAGOGASTRODUODENOSCOPY (EGD) WITH PROPOFOL (N/A)  Patient location during evaluation: PACU Anesthesia Type: General Level of consciousness: awake Pain management: pain level controlled Vital Signs Assessment: post-procedure vital signs reviewed and stable Respiratory status: spontaneous breathing Cardiovascular status: stable Anesthetic complications: no     Last Vitals:  Vitals:   04/12/16 2012 04/13/16 0522  BP: 101/60 100/71  Pulse: 66 (!) 57  Resp: 20 16  Temp: 36.6 C 36.6 C    Last Pain:  Vitals:   04/13/16 0600  TempSrc:   PainSc: Asleep                 VAN STAVEREN,Kery Haltiwanger

## 2016-04-13 NOTE — Consult Note (Signed)
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  Pt to possibly have procedure/surgery, MD wants to switch from xarelto to treatment dose enoxaparin  Allergies  Allergen Reactions  . Vicodin [Hydrocodone-Acetaminophen] Other (See Comments)    Nose bleed   Patient Measurements: Height: 5\' 8"  (172.7 cm) Weight: (!) 327 lb 6.4 oz (148.5 kg) IBW/kg (Calculated) : 68.4  Vital Signs: Temp: 97.8 F (36.6 C) (02/16 0744) Temp Source: Oral (02/16 0744) BP: 99/59 (02/16 0744) Pulse Rate: 63 (02/16 0744)  Labs:  Recent Labs  04/12/16 0547 04/12/16 0843 04/13/16 0550  HGB 14.5  --  13.2  HCT 44.2  --  38.7*  PLT 232  --  208  APTT  --  32  --   LABPROT  --  19.1*  --   INR  --  1.59  --   CREATININE 0.98  --  0.90  TROPONINI <0.03  --   --     Estimated Creatinine Clearance: 153.4 mL/min (by C-G formula based on SCr of 0.9 mg/dL).  Medications:  Scheduled:  . amLODipine  5 mg Oral Daily  . enoxaparin (LOVENOX) injection  1 mg/kg Subcutaneous Q12H  . Influenza vac split quadrivalent PF  0.5 mL Intramuscular Tomorrow-1000  . lisinopril  20 mg Oral Daily  . pantoprazole (PROTONIX) IV  40 mg Intravenous Q12H  . 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 Last dose 0500 on 2/15. Pt is to go for HIDA scan and may possibly need surgery. Dr. Everlene FarrierPabon requested that pt be transitioned to enoxaparin.   Goal of Therapy:   Monitor platelets by anticoagulation protocol: Yes   Plan:  Continue enoxaparin 1 mg/kg subQ q12h. CBC and SCr to be checked at least every 72 hours per protocol.  Cindi CarbonMary M Reynalda Canny, Pharm.D, BCPS Clinical Pharmacist 04/13/2016,9:35 AM

## 2016-04-13 NOTE — Progress Notes (Signed)
[employer] -  at [facility]   PATIENT NAME: Jack Sullivan    MR#:  [id number]  DATE OF BIRTH:  February 20, 1976  SUBJECTIVE:   Pt. Here due to RUQ, Epigastric Pain.  Seen earlier today and was still having some vague abdominal pain, but no N/V or fever.    REVIEW OF SYSTEMS:    Review of Systems  Constitutional: Negative for chills and fever.  HENT: Negative for congestion and tinnitus.   Eyes: Negative for blurred vision and double vision.  Respiratory: Negative for cough, shortness of breath and wheezing.   Cardiovascular: Negative for chest pain, orthopnea and PND.  Gastrointestinal: Positive for abdominal pain. Negative for diarrhea, nausea and vomiting.  Genitourinary: Negative for dysuria and hematuria.  Neurological: Negative for dizziness, sensory change and focal weakness.  All other systems reviewed and are negative.   Nutrition: Clear liquid Tolerating Diet: Yes Tolerating PT: Ambulatory     DRUG ALLERGIES:   Allergies  Allergen Reactions  . Vicodin [Hydrocodone-Acetaminophen] Other (See Comments)    Nose bleed    VITALS:  Blood pressure 111/75, pulse 62, temperature 97.6 F (36.4 C), temperature source Oral, resp. rate 18, height 5\' 8"  (1.727 m), weight (!) 148.5 kg (327 lb 6.4 oz), SpO2 97 %.  PHYSICAL EXAMINATION:   Physical Exam  GENERAL:  41 y.o.-year-old obese patient lying in bed in no acute distress.  EYES: Pupils equal, round, reactive to light and accommodation. No scleral icterus. Extraocular muscles intact.  HEENT: Head atraumatic, normocephalic. Oropharynx and nasopharynx clear.  NECK:  Supple, no jugular venous distention. No thyroid enlargement, no tenderness.  LUNGS: Normal breath sounds bilaterally, no wheezing, rales, rhonchi. No use of accessory muscles of respiration.  CARDIOVASCULAR: S1, S2 normal. No murmurs, rubs, or gallops.  ABDOMEN: Soft, Tender in RUQ but no rebound, rigidity, nondistended. Bowel sounds  present. No organomegaly or mass.  EXTREMITIES: No cyanosis, clubbing or edema b/l.    NEUROLOGIC: Cranial nerves II through XII are intact. No focal Motor or sensory deficits b/l.   PSYCHIATRIC: The patient is alert and oriented x 3.  SKIN: No obvious rash, lesion, or ulcer.    LABORATORY PANEL:   CBC  Recent Labs Lab 04/13/16 0550  WBC 7.6  HGB 13.2  HCT 38.7*  PLT 208   ------------------------------------------------------------------------------------------------------------------  Chemistries   Recent Labs Lab 04/12/16 0547 04/13/16 0550  NA 142 140  K 4.6 4.0  CL 106 105  CO2 29 30  GLUCOSE 118* 94  BUN 24* 21*  CREATININE 0.98 0.90  CALCIUM 9.2 8.2*  AST 28  --   ALT 31  --   ALKPHOS 86  --   BILITOT 0.7  --    ------------------------------------------------------------------------------------------------------------------  Cardiac Enzymes  Recent Labs Lab 04/12/16 0547  TROPONINI <0.03   ------------------------------------------------------------------------------------------------------------------  RADIOLOGY:  Dg Chest 2 View  Result Date: 04/12/2016 CLINICAL DATA:  Sharp chest pain since midnight. History of blood clots in the lungs. EXAM: CHEST  2 VIEW COMPARISON:  12/29/2015 FINDINGS: Slightly shallow inspiration. Heart size and pulmonary vascularity are normal. No focal airspace disease or consolidation in the lungs. Blunting of the right costophrenic angle is unchanged since prior study and may represent pleural thickening. IMPRESSION: No active cardiopulmonary disease. Electronically Signed   By: Burman Nieves M.D.   On: 04/12/2016 06:25   Ct Angio Chest Pe W Or Wo Contrast  Result Date: 04/12/2016 CLINICAL DATA:  Epigastric pain radiating to back. EXAM: CT ANGIOGRAPHY CHEST CT ABDOMEN  AND PELVIS WITH CONTRAST TECHNIQUE: Multidetector CT imaging of the chest was performed using the standard protocol during bolus administration of  intravenous contrast. Multiplanar CT image reconstructions and MIPs were obtained to evaluate the vascular anatomy. Multidetector CT imaging of the abdomen and pelvis was performed using the standard protocol during bolus administration of intravenous contrast. CONTRAST:  100 cc Isovue 370 IV COMPARISON:  Chest CT 03/01/2013.  CT abdomen and pelvis 09/11/2015. FINDINGS: CTA CHEST FINDINGS Cardiovascular: No filling defects in the pulmonary arteries to suggest pulmonary emboli. Heart and mediastinal contours are within normal limits. No focal opacities or effusions. No acute bony abnormality. Mediastinum/Nodes: No mediastinal, hilar, or axillary adenopathy. Lungs/Pleura: Lungs are clear. No focal airspace opacities or suspicious nodules. No effusions. Musculoskeletal: No acute bony abnormality or focal bone lesion. Review of the MIP images confirms the above findings. CT ABDOMEN and PELVIS FINDINGS Hepatobiliary: Numerous gallstones within the gallbladder. No focal hepatic abnormality. No biliary ductal dilatation. Pancreas: No focal abnormality or ductal dilatation. Spleen: No focal abnormality.  Normal size. Adrenals/Urinary Tract: No adrenal abnormality. No focal renal abnormality. No stones or hydronephrosis. Urinary bladder is unremarkable. Stomach/Bowel: Stomach, large and small bowel grossly unremarkable. Vascular/Lymphatic: No evidence of aneurysm or adenopathy. Reproductive: No visible focal abnormality. Other: No free fluid or free air. Musculoskeletal: No acute bony abnormality or focal bone lesion. Review of the MIP images confirms the above findings. IMPRESSION: No evidence of pulmonary embolus.  No acute cardiopulmonary disease. Cholelithiasis. No acute findings in the abdomen or pelvis. Electronically Signed   By: Charlett NoseKevin  Dover M.D.   On: 04/12/2016 11:10   Nm Hepatobiliary Liver Func  Result Date: 04/13/2016 CLINICAL DATA:  Epigastric pain. Known gallstones by ultrasound and CT. EXAM: NUCLEAR  MEDICINE HEPATOBILIARY IMAGING TECHNIQUE: Sequential images of the abdomen were obtained out to 60 minutes following intravenous administration of radiopharmaceutical. RADIOPHARMACEUTICALS:  4.95 mCi Tc-2130m  Choletec IV COMPARISON:  None. FINDINGS: Prompt uptake and biliary excretion of activity by the liver. Radiopharmaceutical identified in the small bowel. There was no filling of the gallbladder after 1 hour. As a result, patient was given 2 mg of morphine. Additional hour of imaging was performed. The gallbladder does not confidently fill during the second hour of imaging. There is a focus of uptake in the expected region of the cystic duct at the porta hepatis. Review of the recent CT imaging demonstrates a dilated structure in this area which could represent the cystic duct or the gallbladder base. This could represent filling of the cystic duct or gallbladder base but the remainder the gallbladder does not fill. IMPRESSION: Gallbladder is not confidently identified after 2 hours. Focal uptake at the expected location of the cystic duct and this could represent a cystic duct obstruction which can be associated with cholecystitis. Electronically Signed   By: Richarda OverlieAdam  Henn M.D.   On: 04/13/2016 16:51   Ct Abdomen Pelvis W Contrast  Result Date: 04/12/2016 CLINICAL DATA:  Epigastric pain radiating to back. EXAM: CT ANGIOGRAPHY CHEST CT ABDOMEN AND PELVIS WITH CONTRAST TECHNIQUE: Multidetector CT imaging of the chest was performed using the standard protocol during bolus administration of intravenous contrast. Multiplanar CT image reconstructions and MIPs were obtained to evaluate the vascular anatomy. Multidetector CT imaging of the abdomen and pelvis was performed using the standard protocol during bolus administration of intravenous contrast. CONTRAST:  100 cc Isovue 370 IV COMPARISON:  Chest CT 03/01/2013.  CT abdomen and pelvis 09/11/2015. FINDINGS: CTA CHEST FINDINGS Cardiovascular: No filling defects in  the pulmonary arteries to suggest pulmonary emboli. Heart and mediastinal contours are within normal limits. No focal opacities or effusions. No acute bony abnormality. Mediastinum/Nodes: No mediastinal, hilar, or axillary adenopathy. Lungs/Pleura: Lungs are clear. No focal airspace opacities or suspicious nodules. No effusions. Musculoskeletal: No acute bony abnormality or focal bone lesion. Review of the MIP images confirms the above findings. CT ABDOMEN and PELVIS FINDINGS Hepatobiliary: Numerous gallstones within the gallbladder. No focal hepatic abnormality. No biliary ductal dilatation. Pancreas: No focal abnormality or ductal dilatation. Spleen: No focal abnormality.  Normal size. Adrenals/Urinary Tract: No adrenal abnormality. No focal renal abnormality. No stones or hydronephrosis. Urinary bladder is unremarkable. Stomach/Bowel: Stomach, large and small bowel grossly unremarkable. Vascular/Lymphatic: No evidence of aneurysm or adenopathy. Reproductive: No visible focal abnormality. Other: No free fluid or free air. Musculoskeletal: No acute bony abnormality or focal bone lesion. Review of the MIP images confirms the above findings. IMPRESSION: No evidence of pulmonary embolus.  No acute cardiopulmonary disease. Cholelithiasis. No acute findings in the abdomen or pelvis. Electronically Signed   By: Charlett Nose M.D.   On: 04/12/2016 11:10   US Abdomen Limited Ruq  Result Date: 04/12/2016 CLINICAL DATA:  6 hours of abdominal pain associated with vomiting. History of previous umbilical hernia repair. Known gallstones. EXAM: US ABDOMEN LIMITED - RIGHT UPPER QUADRANT COMPARISON:  Right upper quadrant abdominal ultrasound of December 29, 2015 FINDINGS: Gallbladder: The gallbladder is adequately distended. There are multiple echogenic mobile shadowing stones. A non mobile stone is demonstrated in the neck which measures 16 mm in diameter. The gallbladder wall is top normal at 3.3 mm. There is no pericholecystic  fluid or positive sonographic Murphy's sign. Common bile duct: Diameter: 5.1 mm Liver: The hepatic echotexture is mildly increased diffusely. There is no focal mass nor ductal dilation. The surface contour of the liver is normal. IMPRESSION: Multiple gallstones. One stone appears impacted in the gallbladder neck which is similar to the appearance seen in November 2017. No sonographic evidence of acute cholecystitis. Normal common bile duct. Increased hepatic echotexture compatible with fatty infiltrative change. Electronically Signed   By: David  [place] M.D.   On: 04/12/2016 08:24     ASSESSMENT AND PLAN:   41 yo male w/ hx of Recurrent PE/DVT, HTN, GERD, depression who presented to the hospital for RUQ abdominal pain, epigastric pain.   1. Epigastric/RUQ abdominal pain - likely due to acute cholecystitis.  - HIDA Scan + for acute cholecystitis.  - cont. Zosyn, supportive care with IV fluids, pain control.   - followed by surgery and plan for possible lap chole if not improving.   2. Hx of recurrent dVT/PE - cont. Lovenox.   3. Depression - cont. Effexor  4. GERD - cont. Protonix.   5. HTN - cont. Norvasc, Lisinopril    All the records are reviewed and case discussed with Care Management/Social Worker. Management plans discussed with the patient, family and they are in agreement.  CODE STATUS: Full code  DVT Prophylaxis: Lovenox  TOTAL TIME TAKING CARE OF THIS PATIENT: 30 minutes.   POSSIBLE D/C IN 1-2 DAYS, DEPENDING ON CLINICAL CONDITION.   Houston Siren M.D on 04/13/2016 at 10:58 PM  Between 7am to 6pm - Pager - [phone]  After 6pm go to [email or web address] - Social research officer, government  [facility]  Office  [phone]  CC: Primary care physician; Baruch Gouty, MD

## 2016-04-13 NOTE — Plan of Care (Signed)
Problem: Nutrition: Goal: Adequate nutrition will be maintained Outcome: Progressing NPO for procedure

## 2016-04-13 NOTE — Progress Notes (Addendum)
CC: Abd pain GS  Subjective: Feeling better, mild pain, no N/V. EGD no signficant pathology to explain sxs CT Abd/P /Chest personally reviewed, GS no cholecystitis. No acute intra-abdominal pathology. No free air  No evidence of PE LFT and wbc nml  Objective: Vital signs in last 24 hours: Temp:  [97.4 F (36.3 C)-98.4 F (36.9 C)] 97.8 F (36.6 C) (02/16 0744) Pulse Rate:  [57-85] 63 (02/16 0744) Resp:  [12-20] 17 (02/16 0744) BP: (99-139)/(54-98) 99/59 (02/16 0744) SpO2:  [94 %-100 %] 96 % (02/16 0744) Weight:  [145.2 kg (320 lb)-148.5 kg (327 lb 6.4 oz)] 148.5 kg (327 lb 6.4 oz) (02/15 1700) Last BM Date: 04/12/16  Intake/Output from previous day: 02/15 0701 - 02/16 0700 In: 1959 [P.O.:780; I.V.:1179] Out: 0  Intake/Output this shift: Total I/O In: 170 [I.V.:170] Out: -   Physical exam:Moridly obese male in nad Chest: Decrease bs on bases, NSR, no murmurs Abd: obese, soft, NT, no peritonitis , no murphy Ext: Well perfused, warm to touch  Lab Results: CBC   Recent Labs  04/12/16 0547 04/13/16 0550  WBC 9.1 7.6  HGB 14.5 13.2  HCT 44.2 38.7*  PLT 232 208   BMET  Recent Labs  04/12/16 0547 04/13/16 0550  NA 142 140  K 4.6 4.0  CL 106 105  CO2 29 30  GLUCOSE 118* 94  BUN 24* 21*  CREATININE 0.98 0.90  CALCIUM 9.2 8.2*   PT/INR  Recent Labs  04/12/16 0843  LABPROT 19.1*  INR 1.59   ABG No results for input(s): PHART, HCO3 in the last 72 hours.  Invalid input(s): PCO2, PO2  Studies/Results: Dg Chest 2 View  Result Date: 04/12/2016 CLINICAL DATA:  Sharp chest pain since midnight. History of blood clots in the lungs. EXAM: CHEST  2 VIEW COMPARISON:  12/29/2015 FINDINGS: Slightly shallow inspiration. Heart size and pulmonary vascularity are normal. No focal airspace disease or consolidation in the lungs. Blunting of the right costophrenic angle is unchanged since prior study and may represent pleural thickening. IMPRESSION: No active  cardiopulmonary disease. Electronically Signed   By: Burman Nieves M.D.   On: 04/12/2016 06:25   Ct Angio Chest Pe W Or Wo Contrast  Result Date: 04/12/2016 CLINICAL DATA:  Epigastric pain radiating to back. EXAM: CT ANGIOGRAPHY CHEST CT ABDOMEN AND PELVIS WITH CONTRAST TECHNIQUE: Multidetector CT imaging of the chest was performed using the standard protocol during bolus administration of intravenous contrast. Multiplanar CT image reconstructions and MIPs were obtained to evaluate the vascular anatomy. Multidetector CT imaging of the abdomen and pelvis was performed using the standard protocol during bolus administration of intravenous contrast. CONTRAST:  100 cc Isovue 370 IV COMPARISON:  Chest CT 03/01/2013.  CT abdomen and pelvis 09/11/2015. FINDINGS: CTA CHEST FINDINGS Cardiovascular: No filling defects in the pulmonary arteries to suggest pulmonary emboli. Heart and mediastinal contours are within normal limits. No focal opacities or effusions. No acute bony abnormality. Mediastinum/Nodes: No mediastinal, hilar, or axillary adenopathy. Lungs/Pleura: Lungs are clear. No focal airspace opacities or suspicious nodules. No effusions. Musculoskeletal: No acute bony abnormality or focal bone lesion. Review of the MIP images confirms the above findings. CT ABDOMEN and PELVIS FINDINGS Hepatobiliary: Numerous gallstones within the gallbladder. No focal hepatic abnormality. No biliary ductal dilatation. Pancreas: No focal abnormality or ductal dilatation. Spleen: No focal abnormality.  Normal size. Adrenals/Urinary Tract: No adrenal abnormality. No focal renal abnormality. No stones or hydronephrosis. Urinary bladder is unremarkable. Stomach/Bowel: Stomach, large and small bowel grossly unremarkable.  Vascular/Lymphatic: No evidence of aneurysm or adenopathy. Reproductive: No visible focal abnormality. Other: No free fluid or free air. Musculoskeletal: No acute bony abnormality or focal bone lesion. Review of the  MIP images confirms the above findings. IMPRESSION: No evidence of pulmonary embolus.  No acute cardiopulmonary disease. Cholelithiasis. No acute findings in the abdomen or pelvis. Electronically Signed   By: Charlett NoseKevin  Dover M.D.   On: 04/12/2016 11:10   Ct Abdomen Pelvis W Contrast  Result Date: 04/12/2016 CLINICAL DATA:  Epigastric pain radiating to back. EXAM: CT ANGIOGRAPHY CHEST CT ABDOMEN AND PELVIS WITH CONTRAST TECHNIQUE: Multidetector CT imaging of the chest was performed using the standard protocol during bolus administration of intravenous contrast. Multiplanar CT image reconstructions and MIPs were obtained to evaluate the vascular anatomy. Multidetector CT imaging of the abdomen and pelvis was performed using the standard protocol during bolus administration of intravenous contrast. CONTRAST:  100 cc Isovue 370 IV COMPARISON:  Chest CT 03/01/2013.  CT abdomen and pelvis 09/11/2015. FINDINGS: CTA CHEST FINDINGS Cardiovascular: No filling defects in the pulmonary arteries to suggest pulmonary emboli. Heart and mediastinal contours are within normal limits. No focal opacities or effusions. No acute bony abnormality. Mediastinum/Nodes: No mediastinal, hilar, or axillary adenopathy. Lungs/Pleura: Lungs are clear. No focal airspace opacities or suspicious nodules. No effusions. Musculoskeletal: No acute bony abnormality or focal bone lesion. Review of the MIP images confirms the above findings. CT ABDOMEN and PELVIS FINDINGS Hepatobiliary: Numerous gallstones within the gallbladder. No focal hepatic abnormality. No biliary ductal dilatation. Pancreas: No focal abnormality or ductal dilatation. Spleen: No focal abnormality.  Normal size. Adrenals/Urinary Tract: No adrenal abnormality. No focal renal abnormality. No stones or hydronephrosis. Urinary bladder is unremarkable. Stomach/Bowel: Stomach, large and small bowel grossly unremarkable. Vascular/Lymphatic: No evidence of aneurysm or adenopathy.  Reproductive: No visible focal abnormality. Other: No free fluid or free air. Musculoskeletal: No acute bony abnormality or focal bone lesion. Review of the MIP images confirms the above findings. IMPRESSION: No evidence of pulmonary embolus.  No acute cardiopulmonary disease. Cholelithiasis. No acute findings in the abdomen or pelvis. Electronically Signed   By: Charlett NoseKevin  Dover M.D.   On: 04/12/2016 11:10   [place] Abdomen Limited Ruq  Result Date: 04/12/2016 CLINICAL DATA:  6 hours of abdominal pain associated with vomiting. History of previous umbilical hernia repair. Known gallstones. EXAM: US ABDOMEN LIMITED - RIGHT UPPER QUADRANT COMPARISON:  Right upper quadrant abdominal ultrasound of December 29, 2015 FINDINGS: Gallbladder: The gallbladder is adequately distended. There are multiple echogenic mobile shadowing stones. A non mobile stone is demonstrated in the neck which measures 16 mm in diameter. The gallbladder wall is top normal at 3.3 mm. There is no pericholecystic fluid or positive sonographic Murphy's sign. Common bile duct: Diameter: 5.1 mm Liver: The hepatic echotexture is mildly increased diffusely. There is no focal mass nor ductal dilation. The surface contour of the liver is normal. IMPRESSION: Multiple gallstones. One stone appears impacted in the gallbladder neck which is similar to the appearance seen in November 2017. No sonographic evidence of acute cholecystitis. Normal common bile duct. Increased hepatic echotexture compatible with fatty infiltrative change. Electronically Signed   By: David  [place] M.D.   On: 04/12/2016 08:24    Anti-infectives: Anti-infectives    None      Assessment/Plan: Abd pain ? From GS, has a pending HIDA to r/o cholecystitis. Continue holding PO anticoagulant and continue lovenox No need for emergent surgical intervention continue to follow D/w hospitalist in detail  [facility] CenterDiego  Everlene Farrier, MD, FACS  04/13/2016

## 2016-04-14 LAB — PROTIME-INR
INR: 1.15
Prothrombin Time: 14.8 seconds (ref 11.4–15.2)

## 2016-04-14 MED ORDER — METRONIDAZOLE 500 MG PO TABS
500.0000 mg | ORAL_TABLET | Freq: Three times a day (TID) | ORAL | Status: DC
  Administered 2016-04-14: 500 mg via ORAL
  Filled 2016-04-14: qty 1

## 2016-04-14 MED ORDER — CIPROFLOXACIN HCL 500 MG PO TABS: 500.0000 mg | ORAL_TABLET | Freq: Two times a day (BID) | ORAL | 0 refills | Status: AC

## 2016-04-14 MED ORDER — METRONIDAZOLE 500 MG PO TABS: 500.0000 mg | ORAL_TABLET | Freq: Three times a day (TID) | ORAL | 0 refills | Status: AC

## 2016-04-14 MED ORDER — CIPROFLOXACIN HCL 500 MG PO TABS
500.0000 mg | ORAL_TABLET | Freq: Two times a day (BID) | ORAL | Status: DC
  Administered 2016-04-14: 500 mg via ORAL
  Filled 2016-04-14: qty 1

## 2016-04-14 NOTE — Discharge Summary (Signed)
[facility] - [place] [facility]   PATIENT NAME: Jack Sullivan    MR#:  [id number]  DATE OF BIRTH:  1975/06/23  DATE OF ADMISSION:  04/12/2016 ADMITTING PHYSICIAN: Altamese Dilling, MD  DATE OF DISCHARGE: 04/14/2016  1:13 PM  PRIMARY CARE PHYSICIAN: Baruch Gouty, MD    ADMISSION DIAGNOSIS:  Epigastric pain [R10.13]  DISCHARGE DIAGNOSIS:  Principal Problem:   Epigastric pain Active Problems:   Epigastric abdominal pain   Other specified diseases of esophagus   SECONDARY DIAGNOSIS:   Past Medical History:  Diagnosis Date  . Chronic pain   . Depression   . GERD (gastroesophageal reflux disease)   . Hypertension   . Morbid obesity (HCC)   . OSA (obstructive sleep apnea)    sleep study done 05/2012  . PE (pulmonary embolism) Nov 2013   bilateral following R leg injury  . 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 Peripheral neuropathy (HCC)   . Personal history of pulmonary embolism 12/28/2011   bilateral following R leg injury     HOSPITAL COURSE:   41 yo male w/ hx of Recurrent PE/DVT, HTN, GERD, depression who presented to the hospital for RUQ abdominal pain, epigastric pain.   1. Epigastric/RUQ abdominal pain - This was suspected to be secondary to acute cholecystitis. -Patient underwent extensive testing including CT of the abdomen and pelvis, right upper quadrant ultrasound, hepatobiliary scan. Patient's tests were unequivocal for acute cholecystitis. -Patient was seen both by general surgery and gastroenterology while in the hospital. As per GI patient did not any acute further interventions. Surgery started the patient empirically on oral Cipro and Flagyl and follow up with him as an outpatient. Patient's diet was slowly advanced from liquid to a low-fat diet which she was tolerating without any exacerbation of his abdominal pain nausea vomiting.  2. Hx of recurrent dVT/PE - while in the hospital patient was placed on  subcutaneous Lovenox twice a day, but he will resume his Xarelto upon discharge.  3. Depression - he will cont. Effexor  4. GERD - he will cont. His Omeprazole.   5. HTN - he will cont. Norvasc, Lisinopril  DISCHARGE CONDITIONS:   Stable.   CONSULTS OBTAINED:  Treatment Team:  Leafy Ro, MD Midge Minium, MD  DRUG ALLERGIES:   Allergies  Allergen Reactions  . Vicodin [Hydrocodone-Acetaminophen] Other (See Comments)    Nose bleed    DISCHARGE MEDICATIONS:   Allergies as of 04/14/2016      Reactions   Vicodin [hydrocodone-acetaminophen] Other (See Comments)   Nose bleed      Medication List    TAKE these medications   amLODipine 5 MG tablet Commonly known as:  NORVASC Take 1 tablet (5 mg total) by mouth daily. Notes to patient:  Last dose was taken on Saturday 04/14/16 at 9:58 am   ciprofloxacin 500 MG tablet Commonly known as:  CIPRO Take 1 tablet (500 mg total) by mouth 2 (two) times daily. Notes to patient:  Last dose was taken on Saturday 04/14/16 at 9:58 am   famotidine 40 MG tablet Commonly known as:  PEPCID Take 1 tablet (40 mg total) by mouth every evening.   lisinopril 20 MG tablet Commonly known as:  PRINIVIL,ZESTRIL Take 1 tablet (20 mg total) by mouth daily. Notes to patient:  Last dose was taken on Saturday 04/14/16 at 11:00 am   metroNIDAZOLE 500 MG tablet Commonly known as:  FLAGYL Take 1 tablet (500 mg total)  by mouth every 8 (eight) hours. Notes to patient:  Last dose was taken on Saturday 04/14/16 at 9:58 am   omeprazole 20 MG capsule Commonly known as:  PRILOSEC Take 1 capsule (20 mg total) by mouth 2 (two) times daily before a meal. If needed; long-term use carries risk; talk to your doctor What changed:  how much to take  when to take this  additional instructions Notes to patient:  Last dose was taken on Saturday 04/14/16 at 9:58 am   ondansetron 4 MG tablet Commonly known as:  ZOFRAN Take 1 tablet (4 mg total) by mouth  every 8 (eight) hours as needed for nausea or vomiting.   traZODone 50 MG tablet Commonly known as:  DESYREL Take 1-2 tablets (50-100 mg total) by mouth at bedtime as needed for sleep.   venlafaxine XR 150 MG 24 hr capsule Commonly known as:  EFFEXOR-XR Take 1 capsule (150 mg total) by mouth daily with breakfast. (this replaces Cymbalta / duloxetine)   XARELTO 20 MG Tabs tablet Generic drug:  rivaroxaban TAKE 1 TABLET BY MOUTH ONCE DAILY WITH SUPPER         DISCHARGE INSTRUCTIONS:   DIET:  Cardiac diet  DISCHARGE CONDITION:  Stable  ACTIVITY:  Activity as tolerated  OXYGEN:  Home Oxygen: No.   Oxygen Delivery: room air  DISCHARGE LOCATION:  home   If you experience worsening of your admission symptoms, develop shortness of breath, life threatening emergency, suicidal or homicidal thoughts you must seek medical attention immediately by calling 911 or calling your MD immediately  if symptoms less severe.  You Must read complete instructions/literature along with all the possible adverse reactions/side effects for all the Medicines you take and that have been prescribed to you. Take any new Medicines after you have completely understood and accpet all the possible adverse reactions/side effects.   Please note  You were cared for by a hospitalist during your hospital stay. If you have any questions about your discharge medications or the care you received while you were in the hospital after you are discharged, you can call the unit and asked to speak with the hospitalist on call if the hospitalist that took care of you is not available. Once you are discharged, your primary care physician will handle any further medical issues. Please note that NO REFILLS for any discharge medications will be authorized once you are discharged, as it is imperative that you return to your primary care physician (or establish a relationship with a primary care physician if you do not have one)  for your aftercare needs so that they can reassess your need for medications and monitor your lab values.     Today   No abdominal pain, N/V. Tolerating regular diet well. D/c home later today.   VITAL SIGNS:  Blood pressure 110/86, pulse 71, temperature 97.7 F (36.5 C), temperature source Oral, resp. rate 17, height 5\' 8"  (1.727 m), weight (!) 148.5 kg (327 lb 6.4 oz), SpO2 100 %.  I/O:   Intake/Output Summary (Last 24 hours) at 04/14/16 1514 Last data filed at 04/14/16 1200  Gross per 24 hour  Intake             2482 ml  Output                0 ml  Net             2482 ml    PHYSICAL EXAMINATION:  GENERAL:  41 y.o.-year-old obese  patient lying in bed in no acute distress.  EYES: Pupils equal, round, reactive to light and accommodation. No scleral icterus. Extraocular muscles intact.  HEENT: Head atraumatic, normocephalic. Oropharynx and nasopharynx clear.  NECK:  Supple, no jugular venous distention. No thyroid enlargement, no tenderness.  LUNGS: Normal breath sounds bilaterally, no wheezing, rales,rhonchi. No use of accessory muscles of respiration.  CARDIOVASCULAR: S1, S2 normal. No murmurs, rubs, or gallops.  ABDOMEN: Soft, non-tender, non-distended. Bowel sounds present. No organomegaly or mass.  EXTREMITIES: No pedal edema, cyanosis, or clubbing.  NEUROLOGIC: Cranial nerves II through XII are intact. No focal motor or sensory defecits b/l.  PSYCHIATRIC: The patient is alert and oriented x 3. Good affect.  SKIN: No obvious rash, lesion, or ulcer.   DATA REVIEW:   CBC  Recent Labs Lab 04/13/16 0550  WBC 7.6  HGB 13.2  HCT 38.7*  PLT 208    Chemistries   Recent Labs Lab 04/12/16 0547 04/13/16 0550  NA 142 140  K 4.6 4.0  CL 106 105  CO2 29 30  GLUCOSE 118* 94  BUN 24* 21*  CREATININE 0.98 0.90  CALCIUM 9.2 8.2*  AST 28  --   ALT 31  --   ALKPHOS 86  --   BILITOT 0.7  --     Cardiac Enzymes  Recent Labs Lab 04/12/16 0547  TROPONINI <0.03     Microbiology Results  Results for orders placed or performed in visit on 11/01/11  Culture, blood (single)     Status: None   Collection Time: 11/01/11 11:27 AM  Result Value Ref Range Status   Micro Text Report   Final       COMMENT                   NO GROWTH AEROBICALLY/ANAEROBICALLY IN 5 DAYS   ANTIBIOTIC                                                      Culture, blood (single)     Status: None   Collection Time: 11/01/11 11:27 AM  Result Value Ref Range Status   Micro Text Report   Final       COMMENT                   NO GROWTH AEROBICALLY/ANAEROBICALLY IN 5 DAYS   ANTIBIOTIC                                                        RADIOLOGY:  Nm Hepatobiliary Liver Func  Result Date: 04/13/2016 CLINICAL DATA:  Epigastric pain. Known gallstones by ultrasound and CT. EXAM: NUCLEAR MEDICINE HEPATOBILIARY IMAGING TECHNIQUE: Sequential images of the abdomen were obtained out to 60 minutes following intravenous administration of radiopharmaceutical. RADIOPHARMACEUTICALS:  4.95 mCi Tc-26m  Choletec IV COMPARISON:  None. FINDINGS: Prompt uptake and biliary excretion of activity by the liver. Radiopharmaceutical identified in the small bowel. There was no filling of the gallbladder after 1 hour. As a result, patient was given 2 mg of morphine. Additional hour of imaging was performed. The gallbladder does not confidently fill during the second hour of imaging. There  is a focus of uptake in the expected region of the cystic duct at the porta hepatis. Review of the recent CT imaging demonstrates a dilated structure in this area which could represent the cystic duct or the gallbladder base. This could represent filling of the cystic duct or gallbladder base but the remainder the gallbladder does not fill. IMPRESSION: Gallbladder is not confidently identified after 2 hours. Focal uptake at the expected location of the cystic duct and this could represent a cystic duct obstruction which  can be associated with cholecystitis. Electronically Signed   By: Richarda OverlieAdam  Henn M.D.   On: 04/13/2016 16:51      Management plans discussed with the patient, family and they are in agreement.  CODE STATUS:     Code Status Orders        Start     Ordered   04/12/16 1610  Full code  Continuous     04/12/16 1609    Code Status History    Date Active Date Inactive Code Status Order ID Comments User Context   This patient has a current code status but no historical code status.      TOTAL TIME TAKING CARE OF THIS PATIENT: 40 minutes.    Houston SirenSAINANI,Malvina Schadler J M.D on 04/14/2016 at 3:14 PM  Between 7am to 6pm - Pager - [phone]  After 6pm go to [email or web address] - Social research officer, [email or web address] [email or web address]  [facility] [place] [facility]  Office  [phone]  CC: Primary care physician; Baruch GoutyMelinda Lada, MD

## 2016-04-14 NOTE — Progress Notes (Signed)
Pt d/c home; d/c instructions reviewed w/ pt; pt understanding was verbalized; IV removed, catheter in tact, gauze dressing applied; all pt questions answered; pt verbalized that all pt belongings were accounted for; pt left unit via wheelchair accompanied by staff 

## 2016-04-14 NOTE — Progress Notes (Signed)
CC: Epigastric Pain Subjective:  The scan was somewhat equivocal. I am not convinced that this is an episode of acute cholecystitis by the rise chronic component of biliary disease. The patient more importantly has done well and is symptom free currently. He is hungry and wants to go home No nausea and vomiting he has tolerated clear liquid diet. Objective: Vital signs in last 24 hours: Temp:  [97.6 F (36.4 C)-98.3 F (36.8 C)] 97.7 F (36.5 C) (02/17 0755) Pulse Rate:  [62-71] 71 (02/17 0755) Resp:  [17-20] 17 (02/17 0755) BP: (104-112)/(50-86) 110/86 (02/17 0755) SpO2:  [97 %-100 %] 100 % (02/17 0755) Last BM Date: 04/14/16  Intake/Output from previous day: 02/16 0701 - 02/17 0700 In: 1954.5 [P.O.:222; I.V.:1632.5; IV Piggyback:100] Out: -  Intake/Output this shift: No intake/output data recorded.  Physical exam: Bili obese male in no acute distress.  Abd: Soft, nontender no peritonitis no Murphy  Ext: No edema. Well perfused    Lab Results: CBC   Recent Labs  04/12/16 0547 04/13/16 0550  WBC 9.1 7.6  HGB 14.5 13.2  HCT 44.2 38.7*  PLT 232 208   BMET  Recent Labs  04/12/16 0547 04/13/16 0550  NA 142 140  K 4.6 4.0  CL 106 105  CO2 29 30  GLUCOSE 118* 94  BUN 24* 21*  CREATININE 0.98 0.90  CALCIUM 9.2 8.2*   PT/INR  Recent Labs  04/12/16 0843 04/14/16 0359  LABPROT 19.1* 14.8  INR 1.59 1.15   ABG No results for input(s): PHART, HCO3 in the last 72 hours.  Invalid input(s): PCO2, PO2  Studies/Results: Ct Angio Chest Pe W Or Wo Contrast  Result Date: 04/12/2016 CLINICAL DATA:  Epigastric pain radiating to back. EXAM: CT ANGIOGRAPHY CHEST CT ABDOMEN AND PELVIS WITH CONTRAST TECHNIQUE: Multidetector CT imaging of the chest was performed using the standard protocol during bolus administration of intravenous contrast. Multiplanar CT image reconstructions and MIPs were obtained to evaluate the vascular anatomy. Multidetector CT imaging of the  abdomen and pelvis was performed using the standard protocol during bolus administration of intravenous contrast. CONTRAST:  100 cc Isovue 370 IV COMPARISON:  Chest CT 03/01/2013.  CT abdomen and pelvis 09/11/2015. FINDINGS: CTA CHEST FINDINGS Cardiovascular: No filling defects in the pulmonary arteries to suggest pulmonary emboli. Heart and mediastinal contours are within normal limits. No focal opacities or effusions. No acute bony abnormality. Mediastinum/Nodes: No mediastinal, hilar, or axillary adenopathy. Lungs/Pleura: Lungs are clear. No focal airspace opacities or suspicious nodules. No effusions. Musculoskeletal: No acute bony abnormality or focal bone lesion. Review of the MIP images confirms the above findings. CT ABDOMEN and PELVIS FINDINGS Hepatobiliary: Numerous gallstones within the gallbladder. No focal hepatic abnormality. No biliary ductal dilatation. Pancreas: No focal abnormality or ductal dilatation. Spleen: No focal abnormality.  Normal size. Adrenals/Urinary Tract: No adrenal abnormality. No focal renal abnormality. No stones or hydronephrosis. Urinary bladder is unremarkable. Stomach/Bowel: Stomach, large and small bowel grossly unremarkable. Vascular/Lymphatic: No evidence of aneurysm or adenopathy. Reproductive: No visible focal abnormality. Other: No free fluid or free air. Musculoskeletal: No acute bony abnormality or focal bone lesion. Review of the MIP images confirms the above findings. IMPRESSION: No evidence of pulmonary embolus.  No acute cardiopulmonary disease. Cholelithiasis. No acute findings in the abdomen or pelvis. Electronically Signed   By: Charlett NoseKevin  Dover M.D.   On: 04/12/2016 11:10   Nm Hepatobiliary Liver Func  Result Date: 04/13/2016 CLINICAL DATA:  Epigastric pain. Known gallstones by ultrasound and CT. EXAM:  NUCLEAR MEDICINE HEPATOBILIARY IMAGING TECHNIQUE: Sequential images of the abdomen were obtained out to 60 minutes following intravenous administration of  radiopharmaceutical. RADIOPHARMACEUTICALS:  4.95 mCi Tc-56m  Choletec IV COMPARISON:  None. FINDINGS: Prompt uptake and biliary excretion of activity by the liver. Radiopharmaceutical identified in the small bowel. There was no filling of the gallbladder after 1 hour. As a result, patient was given 2 mg of morphine. Additional hour of imaging was performed. The gallbladder does not confidently fill during the second hour of imaging. There is a focus of uptake in the expected region of the cystic duct at the porta hepatis. Review of the recent CT imaging demonstrates a dilated structure in this area which could represent the cystic duct or the gallbladder base. This could represent filling of the cystic duct or gallbladder base but the remainder the gallbladder does not fill. IMPRESSION: Gallbladder is not confidently identified after 2 hours. Focal uptake at the expected location of the cystic duct and this could represent a cystic duct obstruction which can be associated with cholecystitis. Electronically Signed   By: Richarda Overlie M.D.   On: 04/13/2016 16:51   Ct Abdomen Pelvis W Contrast  Result Date: 04/12/2016 CLINICAL DATA:  Epigastric pain radiating to back. EXAM: CT ANGIOGRAPHY CHEST CT ABDOMEN AND PELVIS WITH CONTRAST TECHNIQUE: Multidetector CT imaging of the chest was performed using the standard protocol during bolus administration of intravenous contrast. Multiplanar CT image reconstructions and MIPs were obtained to evaluate the vascular anatomy. Multidetector CT imaging of the abdomen and pelvis was performed using the standard protocol during bolus administration of intravenous contrast. CONTRAST:  100 cc Isovue 370 IV COMPARISON:  Chest CT 03/01/2013.  CT abdomen and pelvis 09/11/2015. FINDINGS: CTA CHEST FINDINGS Cardiovascular: No filling defects in the pulmonary arteries to suggest pulmonary emboli. Heart and mediastinal contours are within normal limits. No focal opacities or effusions. No  acute bony abnormality. Mediastinum/Nodes: No mediastinal, hilar, or axillary adenopathy. Lungs/Pleura: Lungs are clear. No focal airspace opacities or suspicious nodules. No effusions. Musculoskeletal: No acute bony abnormality or focal bone lesion. Review of the MIP images confirms the above findings. CT ABDOMEN and PELVIS FINDINGS Hepatobiliary: Numerous gallstones within the gallbladder. No focal hepatic abnormality. No biliary ductal dilatation. Pancreas: No focal abnormality or ductal dilatation. Spleen: No focal abnormality.  Normal size. Adrenals/Urinary Tract: No adrenal abnormality. No focal renal abnormality. No stones or hydronephrosis. Urinary bladder is unremarkable. Stomach/Bowel: Stomach, large and small bowel grossly unremarkable. Vascular/Lymphatic: No evidence of aneurysm or adenopathy. Reproductive: No visible focal abnormality. Other: No free fluid or free air. Musculoskeletal: No acute bony abnormality or focal bone lesion. Review of the MIP images confirms the above findings. IMPRESSION: No evidence of pulmonary embolus.  No acute cardiopulmonary disease. Cholelithiasis. No acute findings in the abdomen or pelvis. Electronically Signed   By: Charlett Nose M.D.   On: 04/12/2016 11:10    Anti-infectives: Anti-infectives    Start     Dose/Rate Route Frequency Ordered Stop   04/14/16 0800  ciprofloxacin (CIPRO) tablet 500 mg     500 mg Oral 2 times daily 04/14/16 0749     04/14/16 0800  metroNIDAZOLE (FLAGYL) tablet 500 mg     500 mg Oral Every 8 hours 04/14/16 0749     04/13/16 2200  piperacillin-tazobactam (ZOSYN) IVPB 4.5 g  Status:  Discontinued     4.5 g 25 mL/hr over 240 Minutes Intravenous Every 8 hours 04/13/16 1831 04/14/16 0749   04/13/16 1830  piperacillin-tazobactam (  ZOSYN) IVPB 3.375 g  Status:  Discontinued     3.375 g 100 mL/hr over 30 Minutes Intravenous Every 8 hours 04/13/16 1828 04/13/16 1830      Assessment/Plan: Chronic gallbladder disease. Clinically he  does not show signs of acute cholecystitis since his symptoms have resolved. I will recommend just a short course of antibiotic therapy will go ahead and advance his diet to a regular diet. Discussed with the patient in detail about options of performing laparoscopic cholecystectomy during inpatient setting versus outpatient setting. He was to go home and does not 1 any surgical intervention at this time. Given his comorbidities he is a high risk for surgical complications. I insisted that in the best case scenario we needed to improve and optimize his medical condition is mainly he is morbid obesity and sleep apnea. He is in agreement and that he wishes to pursue goals to reduce his risk factors. I will see him in my office in a few weeks and for further management. No need for surgical intervention at this time I do think that it is safer the patient to go home and resume his oral anticoagulation   Sterling Big, MD, [facility]  04/14/2016

## 2016-05-08 ENCOUNTER — Telehealth: Payer: Self-pay | Admitting: Family Medicine

## 2016-05-08 NOTE — Telephone Encounter (Signed)
Requesting a refill on lisinopril, xarelto, and amlodipine. [facility] [address] Only have 2 days left. Please call when done [phone]. Pt does have upcoming appt

## 2016-05-08 NOTE — Telephone Encounter (Signed)
Called wife back told her to contact pharmacy our rx says he has another 3 month supply.

## 2016-05-14 ENCOUNTER — Ambulatory Visit: Payer: Self-pay | Admitting: Family Medicine

## 2016-10-16 ENCOUNTER — Emergency Department: Payer: Self-pay

## 2016-10-16 ENCOUNTER — Emergency Department
Admission: EM | Admit: 2016-10-16 | Discharge: 2016-10-16 | Disposition: A | Discharge status: Home / Self Care | Payer: Self-pay | Attending: Emergency Medicine | Admitting: Emergency Medicine

## 2016-10-16 ENCOUNTER — Encounter: Payer: Self-pay | Admitting: *Deleted

## 2016-10-16 DIAGNOSIS — R079 Chest pain, unspecified: Secondary | ICD-10-CM

## 2016-10-16 DIAGNOSIS — Z87891 Personal history of nicotine dependence: Secondary | ICD-10-CM | POA: Insufficient documentation

## 2016-10-16 DIAGNOSIS — Z7901 Long term (current) use of anticoagulants: Secondary | ICD-10-CM | POA: Insufficient documentation

## 2016-10-16 DIAGNOSIS — R1110 Vomiting, unspecified: Secondary | ICD-10-CM | POA: Insufficient documentation

## 2016-10-16 DIAGNOSIS — Z86711 Personal history of pulmonary embolism: Secondary | ICD-10-CM | POA: Insufficient documentation

## 2016-10-16 DIAGNOSIS — I10 Essential (primary) hypertension: Secondary | ICD-10-CM | POA: Insufficient documentation

## 2016-10-16 LAB — COMPREHENSIVE METABOLIC PANEL
ALT: 20 U/L (ref 17–63)
AST: 22 U/L (ref 15–41)
Albumin: 4 g/dL (ref 3.5–5.0)
Alkaline Phosphatase: 79 U/L (ref 38–126)
Anion gap: 9 (ref 5–15)
BILIRUBIN TOTAL: 0.6 mg/dL (ref 0.3–1.2)
BUN: 21 mg/dL — ABNORMAL HIGH (ref 6–20)
CO2: 29 mmol/L (ref 22–32)
Calcium: 9.3 mg/dL (ref 8.9–10.3)
Chloride: 106 mmol/L (ref 101–111)
Creatinine, Ser: 1 mg/dL (ref 0.61–1.24)
GFR calc Af Amer: >60 mL/min (ref >60)
Glucose, Bld: 132 mg/dL — ABNORMAL HIGH (ref 65–99)
Potassium: 4.3 mmol/L (ref 3.5–5.1)
Sodium: 144 mmol/L (ref 135–145)
Total Protein: 7.3 g/dL (ref 6.5–8.1)

## 2016-10-16 LAB — CBC
HEMATOCRIT: 44.8 % (ref 40.0–52.0)
HEMOGLOBIN: 15 g/dL (ref 13.0–18.0)
MCH: 29 pg (ref 26.0–34.0)
MCHC: 33.5 g/dL (ref 32.0–36.0)
MCV: 86.7 fL (ref 80.0–100.0)
PLATELETS: 222 10*3/uL (ref 150–440)
RBC: 5.16 MIL/uL (ref 4.40–5.90)
RDW: 14.3 % (ref 11.5–14.5)
WBC: 11.4 10*3/uL — AB (ref 3.8–10.6)

## 2016-10-16 LAB — FIBRIN DERIVATIVES D-DIMER (ARMC ONLY): Fibrin derivatives D-dimer (ARMC): 105.19 (ref 0.00–499.00)

## 2016-10-16 LAB — TROPONIN I: Troponin I: <0.03 ng/mL (ref <0.03)

## 2016-10-16 MED ORDER — HYDROMORPHONE HCL 1 MG/ML IJ SOLN
1.0000 mg | Freq: Once | INTRAMUSCULAR | Status: AC
  Administered 2016-10-16: 1 mg via INTRAVENOUS
  Filled 2016-10-16: qty 1

## 2016-10-16 MED ORDER — ONDANSETRON HCL 4 MG/2ML IJ SOLN
4.0000 mg | Freq: Once | INTRAMUSCULAR | Status: AC
  Administered 2016-10-16: 4 mg via INTRAVENOUS
  Filled 2016-10-16: qty 2

## 2016-10-16 MED ORDER — SODIUM CHLORIDE 0.9 % IV SOLN
Freq: Once | INTRAVENOUS | Status: AC
  Administered 2016-10-16: 08:00:00 via INTRAVENOUS

## 2016-10-16 MED ORDER — TRAMADOL HCL 50 MG PO TABS: 50.0000 mg | ORAL_TABLET | Freq: Four times a day (QID) | ORAL | 0 refills | Status: DC | PRN

## 2016-10-16 NOTE — ED Provider Notes (Signed)
[facility] Emergency Department Provider Note       Time seen: ----------------------------------------- 7:19 AM on 10/16/2016 -----------------------------------------     I have reviewed the triage vital signs and the nursing notes.   HISTORY   Chief Complaint Chest Pain    HPI Jack Sullivan is a 41 y.o. male who presents to the ED for diffuse lower chest pain Patient states that multiple episodes of vomiting this morning and the symptom onset was around 4 AM. He is on Xarelto with a history of PE. Pain is 8 out of 10 in the chest, nothing makes it better or worse. He describes the pain as tightness. Patient states he has not had pain like this in months.   Past Medical History:  Diagnosis Date  . Chronic pain   . Depression   . GERD (gastroesophageal reflux disease)   . Hypertension   . Morbid obesity (HCC)   . OSA (obstructive sleep apnea)    sleep study done 05/2012  . PE (pulmonary embolism) Nov 2013   bilateral following R leg injury  . 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 Peripheral neuropathy   . Personal history of pulmonary embolism 12/28/2011   bilateral following R leg injury     Patient Active Problem List   Diagnosis Date Noted  . Epigastric abdominal pain 04/12/2016  . Epigastric pain   . Other specified diseases of esophagus   . Insomnia 07/17/2015  . Moderate major depression, single episode (HCC) 07/15/2015  . Fatigue 05/02/2015  . Morbid obesity (HCC)   . GERD (gastroesophageal reflux disease)   . Hypertension   . Peripheral neuropathy   . Chronic pain   . OSA (obstructive sleep apnea)   . Personal history of pulmonary embolism 12/28/2011    Past Surgical History:  Procedure Laterality Date  . CARDIOVASCULAR STRESS TEST  11/2012   normal  . ESOPHAGOGASTRODUODENOSCOPY (EGD) WITH PROPOFOL N/A 04/12/2016   Procedure: ESOPHAGOGASTRODUODENOSCOPY (EGD) WITH PROPOFOL;  Surgeon: Midge Minium,  MD;  Location: [facility] ENDOSCOPY;  Service: Endoscopy;  Laterality: N/A;  . HERNIA REPAIR      Allergies Vicodin [hydrocodone-acetaminophen]  Social History Social History  Substance Use Topics  . Smoking status: Former Smoker    Packs/day: 1.00    Years: 20.00    Types: Cigarettes    Quit date: 02/27/2008  . Smokeless tobacco: Never Used  . Alcohol use No    Review of Systems Constitutional: Negative for fever. Eyes: Negative for vision changes ENT:  Negative for congestion, sore throat Cardiovascular: Positive for chest pain Respiratory: Negative for shortness of breath. Gastrointestinal: Negative for abdominal pain, vomiting and diarrhea. Genitourinary: Negative for dysuria. Musculoskeletal: Negative for back pain. Skin: Negative for rash. Neurological: Negative for headaches, focal weakness or numbness.  All systems negative/normal/unremarkable except as stated in the HPI  ____________________________________________   PHYSICAL EXAM:  VITAL SIGNS: ED Triage Vitals  Enc Vitals Group     BP 10/16/16 0713 128/69     Pulse Rate 10/16/16 0713 64     Resp 10/16/16 0713 20     Temp --      Temp src --      SpO2 10/16/16 0713 100 %     Weight 10/16/16 0713 (!) 309 lb (140.2 kg)     Height 10/16/16 0713 5\' 8"  (1.727 m)     Head Circumference --      Peak Flow --  Pain Score 10/16/16 0709 8     Pain Loc --      Pain Edu? --      Excl. in GC? --     Constitutional: Alert and oriented. Mild distress Eyes: Conjunctivae are normal. Normal extraocular movements. ENT   Head: Normocephalic and atraumatic.   Nose: No congestion/rhinnorhea.   Mouth/Throat: Mucous membranes are moist.   Neck: No stridor. Cardiovascular: Normal rate, regular rhythm. No murmurs, rubs, or gallops. Respiratory: Normal respiratory effort without tachypnea nor retractions. Breath sounds are clear and equal bilaterally. No wheezes/rales/rhonchi. Gastrointestinal: Soft and  nontender. Normal bowel sounds Musculoskeletal: Nontender with normal range of motion in extremities. No lower extremity tenderness nor edema. Neurologic:  Normal speech and language. No gross focal neurologic deficits are appreciated.  Skin:  Skin is warm, dry and intact. No rash noted. Psychiatric: Mood and affect are normal. Speech and behavior are normal.  ____________________________________________  EKG: Interpreted by me. Sinus rhythm with a rate of 55 bpm, normal PR interval, normal QRS, normal QT.  ____________________________________________  ED COURSE:  Pertinent labs & imaging results that were available during my care of the patient were reviewed by me and considered in my medical decision making (see chart for details). Patient presents for his pain, we will assess with labs and imaging as indicated.   Procedures ____________________________________________   LABS (pertinent positives/negatives)  Labs Reviewed  CBC - Abnormal; Notable for the following:       Result Value   WBC 11.4 (*)    All other components within normal limits  COMPREHENSIVE METABOLIC PANEL - Abnormal; Notable for the following:    Glucose, Bld 132 (*)    BUN 21 (*)    All other components within normal limits  TROPONIN I  FIBRIN DERIVATIVES D-DIMER ([facility] ONLY)  TROPONIN I    RADIOLOGY Images were viewed by me  Chest x-ray IMPRESSION: There is no acute cardiopulmonary abnormality. ____________________________________________  FINAL ASSESSMENT AND PLAN  Chest pain  Plan: Patient's labs and imaging were dictated above. Patient had presented for Chest pain of uncertain etiology. This is likely multifactorial and we have repeated her troponin as well as a negative d-dimer. He is stable for outpatient follow-up.   Emily Filbert, MD   Note: This note was generated in part or whole with voice recognition software. Voice recognition is usually quite accurate but there are  transcription errors that can and very often do occur. I apologize for any typographical errors that were not detected and corrected.     Emily Filbert, MD 10/16/16 1018

## 2016-10-16 NOTE — ED Notes (Signed)
Pt to Xray.

## 2016-10-16 NOTE — ED Triage Notes (Signed)
States right sided chest pain that goes to his back, states multiple episidoes of vomiting this AM, pt on xarelto with hx of PE

## 2017-05-01 ENCOUNTER — Observation Stay: Payer: Self-pay

## 2017-05-01 ENCOUNTER — Observation Stay
Admit: 2017-05-01 | Discharge: 2017-05-01 | Disposition: A | Discharge status: Home / Self Care | Payer: Self-pay | Attending: Internal Medicine | Admitting: Internal Medicine

## 2017-05-01 ENCOUNTER — Inpatient Hospital Stay
Admission: EM | Admit: 2017-05-01 | Discharge: 2017-05-04 | DRG: 415 | Disposition: A | Discharge status: Home / Self Care | Payer: Self-pay | Attending: Surgery | Admitting: Surgery

## 2017-05-01 ENCOUNTER — Emergency Department: Payer: Self-pay

## 2017-05-01 ENCOUNTER — Other Ambulatory Visit: Payer: Self-pay

## 2017-05-01 ENCOUNTER — Encounter: Payer: Self-pay | Admitting: Emergency Medicine

## 2017-05-01 DIAGNOSIS — R1011 Right upper quadrant pain: Secondary | ICD-10-CM

## 2017-05-01 DIAGNOSIS — G8929 Other chronic pain: Secondary | ICD-10-CM | POA: Diagnosis present

## 2017-05-01 DIAGNOSIS — Z86711 Personal history of pulmonary embolism: Secondary | ICD-10-CM

## 2017-05-01 DIAGNOSIS — K660 Peritoneal adhesions (postprocedural) (postinfection): Secondary | ICD-10-CM | POA: Diagnosis present

## 2017-05-01 DIAGNOSIS — R1013 Epigastric pain: Secondary | ICD-10-CM | POA: Diagnosis present

## 2017-05-01 DIAGNOSIS — R1110 Vomiting, unspecified: Secondary | ICD-10-CM

## 2017-05-01 DIAGNOSIS — G629 Polyneuropathy, unspecified: Secondary | ICD-10-CM | POA: Diagnosis present

## 2017-05-01 DIAGNOSIS — R079 Chest pain, unspecified: Secondary | ICD-10-CM

## 2017-05-01 DIAGNOSIS — Z6841 Body Mass Index (BMI) 40.0 and over, adult: Secondary | ICD-10-CM

## 2017-05-01 DIAGNOSIS — F329 Major depressive disorder, single episode, unspecified: Secondary | ICD-10-CM | POA: Diagnosis present

## 2017-05-01 DIAGNOSIS — Z8249 Family history of ischemic heart disease and other diseases of the circulatory system: Secondary | ICD-10-CM

## 2017-05-01 DIAGNOSIS — K219 Gastro-esophageal reflux disease without esophagitis: Secondary | ICD-10-CM | POA: Diagnosis present

## 2017-05-01 DIAGNOSIS — K8000 Calculus of gallbladder with acute cholecystitis without obstruction: Principal | ICD-10-CM | POA: Diagnosis present

## 2017-05-01 DIAGNOSIS — Z79899 Other long term (current) drug therapy: Secondary | ICD-10-CM

## 2017-05-01 DIAGNOSIS — Z885 Allergy status to narcotic agent status: Secondary | ICD-10-CM

## 2017-05-01 DIAGNOSIS — G4733 Obstructive sleep apnea (adult) (pediatric): Secondary | ICD-10-CM | POA: Diagnosis present

## 2017-05-01 DIAGNOSIS — Z7901 Long term (current) use of anticoagulants: Secondary | ICD-10-CM

## 2017-05-01 DIAGNOSIS — K429 Umbilical hernia without obstruction or gangrene: Secondary | ICD-10-CM | POA: Diagnosis present

## 2017-05-01 DIAGNOSIS — Z87891 Personal history of nicotine dependence: Secondary | ICD-10-CM

## 2017-05-01 DIAGNOSIS — I10 Essential (primary) hypertension: Secondary | ICD-10-CM | POA: Diagnosis present

## 2017-05-01 LAB — HEPATIC FUNCTION PANEL
ALBUMIN: 3.8 g/dL (ref 3.5–5.0)
ALK PHOS: 94 U/L (ref 38–126)
ALT: 103 U/L — ABNORMAL HIGH (ref 17–63)
AST: 136 U/L — ABNORMAL HIGH (ref 15–41)
Bilirubin, Direct: 0.2 mg/dL (ref 0.1–0.5)
Indirect Bilirubin: 0.7 mg/dL (ref 0.3–0.9)
TOTAL PROTEIN: 6.9 g/dL (ref 6.5–8.1)
Total Bilirubin: 0.9 mg/dL (ref 0.3–1.2)

## 2017-05-01 LAB — APTT
APTT: 28 s (ref 24–36)
aPTT: 50 seconds — ABNORMAL HIGH (ref 24–36)

## 2017-05-01 LAB — PROTIME-INR
INR: 1.03
Prothrombin Time: 13.4 seconds (ref 11.4–15.2)

## 2017-05-01 LAB — CBC
HEMATOCRIT: 45.1 % (ref 40.0–52.0)
Hemoglobin: 14.7 g/dL (ref 13.0–18.0)
MCH: 28 pg (ref 26.0–34.0)
MCHC: 32.5 g/dL (ref 32.0–36.0)
MCV: 86 fL (ref 80.0–100.0)
PLATELETS: 209 10*3/uL (ref 150–440)
RBC: 5.24 MIL/uL (ref 4.40–5.90)
RDW: 13.8 % (ref 11.5–14.5)
WBC: 11.7 10*3/uL — AB (ref 3.8–10.6)

## 2017-05-01 LAB — BASIC METABOLIC PANEL
Anion gap: 10 (ref 5–15)
BUN: 22 mg/dL — AB (ref 6–20)
CALCIUM: 9.3 mg/dL (ref 8.9–10.3)
CO2: 26 mmol/L (ref 22–32)
Chloride: 104 mmol/L (ref 101–111)
Creatinine, Ser: 0.95 mg/dL (ref 0.61–1.24)
GFR calc Af Amer: >60 mL/min (ref >60)
GLUCOSE: 110 mg/dL — AB (ref 65–99)
POTASSIUM: 4.3 mmol/L (ref 3.5–5.1)
Sodium: 140 mmol/L (ref 135–145)

## 2017-05-01 LAB — HEPARIN LEVEL (UNFRACTIONATED)
Heparin Unfractionated: 0.32 IU/mL (ref 0.30–0.70)
Heparin Unfractionated: 0.6 IU/mL (ref 0.30–0.70)

## 2017-05-01 LAB — TROPONIN I

## 2017-05-01 MED ORDER — HYDROMORPHONE HCL 1 MG/ML IJ SOLN
1.0000 mg | Freq: Once | INTRAMUSCULAR | Status: AC
  Administered 2017-05-01: 1 mg via INTRAVENOUS
  Filled 2017-05-01: qty 1

## 2017-05-01 MED ORDER — SODIUM CHLORIDE 0.9 % IV SOLN
INTRAVENOUS | Status: DC
  Administered 2017-05-01 – 2017-05-04 (×5): via INTRAVENOUS

## 2017-05-01 MED ORDER — PANTOPRAZOLE SODIUM 40 MG IV SOLR
40.0000 mg | Freq: Two times a day (BID) | INTRAVENOUS | Status: DC
  Administered 2017-05-01 – 2017-05-04 (×5): 40 mg via INTRAVENOUS
  Filled 2017-05-01 (×5): qty 40

## 2017-05-01 MED ORDER — ONDANSETRON HCL 4 MG PO TABS: 4.0000 mg | ORAL_TABLET | Freq: Three times a day (TID) | ORAL | Status: DC | PRN

## 2017-05-01 MED ORDER — NITROGLYCERIN 2 % TD OINT
0.5000 [in_us] | TOPICAL_OINTMENT | Freq: Four times a day (QID) | TRANSDERMAL | Status: DC
  Administered 2017-05-01 – 2017-05-03 (×7): 0.5 [in_us] via TOPICAL
  Filled 2017-05-01 (×7): qty 1

## 2017-05-01 MED ORDER — ONDANSETRON HCL 4 MG/2ML IJ SOLN
4.0000 mg | Freq: Four times a day (QID) | INTRAMUSCULAR | Status: DC | PRN
  Administered 2017-05-03: 4 mg via INTRAVENOUS
  Filled 2017-05-01: qty 2

## 2017-05-01 MED ORDER — ACETAMINOPHEN 650 MG RE SUPP: 650.0000 mg | Freq: Four times a day (QID) | RECTAL | Status: DC | PRN

## 2017-05-01 MED ORDER — RIVAROXABAN 10 MG PO TABS: 10.0000 mg | ORAL_TABLET | Freq: Every day | ORAL | Status: DC

## 2017-05-01 MED ORDER — PANTOPRAZOLE SODIUM 40 MG PO TBEC
40.0000 mg | DELAYED_RELEASE_TABLET | Freq: Every day | ORAL | Status: DC
  Administered 2017-05-01: 40 mg via ORAL
  Filled 2017-05-01: qty 1

## 2017-05-01 MED ORDER — SODIUM CHLORIDE 0.9 % IV BOLUS (SEPSIS)
1000.0000 mL | Freq: Once | INTRAVENOUS | Status: AC
  Administered 2017-05-01: 1000 mL via INTRAVENOUS

## 2017-05-01 MED ORDER — AMLODIPINE BESYLATE 5 MG PO TABS
5.0000 mg | ORAL_TABLET | Freq: Every day | ORAL | Status: DC
  Administered 2017-05-01 – 2017-05-02 (×2): 5 mg via ORAL
  Filled 2017-05-01 (×2): qty 1

## 2017-05-01 MED ORDER — ONDANSETRON HCL 4 MG PO TABS: 4.0000 mg | ORAL_TABLET | Freq: Four times a day (QID) | ORAL | Status: DC | PRN

## 2017-05-01 MED ORDER — MORPHINE SULFATE (PF) 4 MG/ML IV SOLN
5.7000 mg | Freq: Once | INTRAVENOUS | Status: AC
  Administered 2017-05-01: 5.7 mg via INTRAVENOUS

## 2017-05-01 MED ORDER — LISINOPRIL 20 MG PO TABS
20.0000 mg | ORAL_TABLET | Freq: Every day | ORAL | Status: DC
  Administered 2017-05-01 – 2017-05-02 (×2): 20 mg via ORAL
  Filled 2017-05-01: qty 1
  Filled 2017-05-01: qty 2

## 2017-05-01 MED ORDER — MORPHINE SULFATE (PF) 4 MG/ML IV SOLN
INTRAVENOUS | Status: AC
  Administered 2017-05-01: 4 mg via INTRAVENOUS
  Filled 2017-05-01: qty 1

## 2017-05-01 MED ORDER — BISACODYL 5 MG PO TBEC
5.0000 mg | DELAYED_RELEASE_TABLET | Freq: Every day | ORAL | Status: DC | PRN
  Administered 2017-05-02: 5 mg via ORAL
  Filled 2017-05-01 (×2): qty 1

## 2017-05-01 MED ORDER — HEPARIN (PORCINE) IN NACL 100-0.45 UNIT/ML-% IJ SOLN
1550.0000 [IU]/h | INTRAMUSCULAR | Status: DC
  Administered 2017-05-01 – 2017-05-02 (×2): 1550 [IU]/h via INTRAVENOUS
  Filled 2017-05-01 (×2): qty 250

## 2017-05-01 MED ORDER — MORPHINE SULFATE (PF) 2 MG/ML IV SOLN
2.0000 mg | INTRAVENOUS | Status: DC | PRN
  Administered 2017-05-01: 2 mg via INTRAVENOUS
  Filled 2017-05-01: qty 1

## 2017-05-01 MED ORDER — ONDANSETRON HCL 4 MG/2ML IJ SOLN
4.0000 mg | Freq: Once | INTRAMUSCULAR | Status: AC
  Administered 2017-05-01: 4 mg via INTRAVENOUS

## 2017-05-01 MED ORDER — ONDANSETRON HCL 4 MG/2ML IJ SOLN
INTRAMUSCULAR | Status: AC
  Administered 2017-05-01: 4 mg via INTRAVENOUS
  Filled 2017-05-01: qty 2

## 2017-05-01 MED ORDER — MORPHINE SULFATE (PF) 4 MG/ML IV SOLN
4.0000 mg | Freq: Once | INTRAVENOUS | Status: AC
  Administered 2017-05-01: 4 mg via INTRAVENOUS

## 2017-05-01 MED ORDER — MORPHINE SULFATE (PF) 4 MG/ML IV SOLN
7.5000 mg | Freq: Once | INTRAVENOUS | Status: DC
  Filled 2017-05-01: qty 2

## 2017-05-01 MED ORDER — METOPROLOL TARTRATE 25 MG PO TABS
25.0000 mg | ORAL_TABLET | Freq: Two times a day (BID) | ORAL | Status: DC
  Administered 2017-05-01 – 2017-05-03 (×5): 25 mg via ORAL
  Filled 2017-05-01 (×5): qty 1

## 2017-05-01 MED ORDER — TECHNETIUM TC 99M MEBROFENIN IV KIT
5.2300 | PACK | Freq: Once | INTRAVENOUS | Status: AC | PRN
  Administered 2017-05-01: 5.23 via INTRAVENOUS

## 2017-05-01 MED ORDER — ESCITALOPRAM OXALATE 20 MG PO TABS
20.0000 mg | ORAL_TABLET | Freq: Every day | ORAL | Status: DC
  Administered 2017-05-01: 20 mg via ORAL
  Filled 2017-05-01: qty 1
  Filled 2017-05-01: qty 2
  Filled 2017-05-01: qty 1

## 2017-05-01 MED ORDER — PIPERACILLIN-TAZOBACTAM 3.375 G IVPB 30 MIN
3.3750 g | Freq: Once | INTRAVENOUS | Status: AC
  Administered 2017-05-01: 3.375 g via INTRAVENOUS
  Filled 2017-05-01: qty 50

## 2017-05-01 MED ORDER — DOCUSATE SODIUM 100 MG PO CAPS
100.0000 mg | ORAL_CAPSULE | Freq: Two times a day (BID) | ORAL | Status: DC
  Administered 2017-05-01 – 2017-05-04 (×4): 100 mg via ORAL
  Filled 2017-05-01 (×4): qty 1

## 2017-05-01 MED ORDER — PERFLUTREN LIPID MICROSPHERE
1.0000 mL | INTRAVENOUS | Status: AC | PRN
  Administered 2017-05-01: 2 mL via INTRAVENOUS
  Filled 2017-05-01: qty 10

## 2017-05-01 MED ORDER — ASPIRIN 81 MG PO CHEW
81.0000 mg | CHEWABLE_TABLET | Freq: Every day | ORAL | Status: DC
  Administered 2017-05-01 – 2017-05-04 (×3): 81 mg via ORAL
  Filled 2017-05-01 (×4): qty 1

## 2017-05-01 MED ORDER — SODIUM CHLORIDE 0.9% FLUSH
3.0000 mL | Freq: Two times a day (BID) | INTRAVENOUS | Status: DC
  Administered 2017-05-01 – 2017-05-04 (×9): 3 mL via INTRAVENOUS

## 2017-05-01 MED ORDER — IOPAMIDOL (ISOVUE-370) INJECTION 76%
75.0000 mL | Freq: Once | INTRAVENOUS | Status: AC | PRN
  Administered 2017-05-01: 75 mL via INTRAVENOUS

## 2017-05-01 NOTE — Progress Notes (Signed)
Nuclear medicine  Called  ,asked for 7.5 mg one time to giveas GB not seen after one hr

## 2017-05-01 NOTE — H&P (Addendum)
[facility] [place] at [facility]   PATIENT NAME: Jack Sullivan    MR#:  [id number]  DATE OF BIRTH:  [birth year]  DATE OF ADMISSION:  05/01/2017  PRIMARY CARE PHYSICIAN: System, Pcp Not In patient PCP is at [facility].  REQUESTING/REFERRING PHYSICIAN: Dr. Daryel November  CHIEF COMPLAINT: Epigastric pain   Chief Complaint  Patient presents with  . Chest Pain  . Emesis    HISTORY OF PRESENT ILLNESS:  Jack Sullivan  is a 42 y.o. male with history of recurrent PE on Xarelto comes in because of epigastric pain going to the left side of the chest and also the back since last night associated with vomiting twice.  Denies any left-sided chest pain.  He complains mainly of epigastric pain, nausea and vomiting.  Troponins have been negative for 1 time.  Patient seen by surgeon Dr.Pabone gallstones that were found on ultrasound.  He recommends medical admission, if the HIDA scan is positive for biliary dyskinesia he will take over the patient.  Patient received multiple doses of morphine, Dilaudid.  And we are admittingfor chest pain rule out.  Patient told me that his pain started in the epigastric area and went up to left side of chest.  And I ordered stat troponins.  EKG unremarkable. Patient takes Xarelto for history of PE and he is on Xarelto since 2014.  CTangio chest t done in the emergency room did not show acute PE.Marland Kitchen PAST MEDICAL HISTORY:   Past Medical History:  Diagnosis Date  . Chronic pain   . Depression   . GERD (gastroesophageal reflux disease)   . Hypertension   . Morbid obesity (HCC)   . OSA (obstructive sleep apnea)    sleep study done 05/2012  . PE (pulmonary embolism) Nov 2013   bilateral following R leg injury  . 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 Peripheral neuropathy   . Personal history of pulmonary embolism 12/28/2011   bilateral following R leg injury     PAST SURGICAL HISTOIRY:   Past Surgical History:  Procedure  Laterality Date  . CARDIOVASCULAR STRESS TEST  11/2012   normal  . ESOPHAGOGASTRODUODENOSCOPY (EGD) WITH PROPOFOL N/A 04/12/2016   Procedure: ESOPHAGOGASTRODUODENOSCOPY (EGD) WITH PROPOFOL;  Surgeon: Midge Minium, MD;  Location: [facility] ENDOSCOPY;  Service: Endoscopy;  Laterality: N/A;  . HERNIA REPAIR      SOCIAL HISTORY:   Social History   Tobacco Use  . Smoking status: Former Smoker    Packs/day: 1.00    Years: 20.00    Pack years: 20.00    Types: Cigarettes    Last attempt to quit: 02/27/2008    Years since quitting: 9.1  . Smokeless tobacco: Never Used  Substance Use Topics  . Alcohol use: No    FAMILY HISTORY:   Family History  Problem Relation Age of Onset  . Heart disease Father   . Hypertension Father   . Lung cancer Mother   . Diabetes Paternal Grandfather   . Heart disease Paternal Grandfather   . Hypertension Paternal Grandfather   . Cancer Neg Hx   . COPD Neg Hx   . Stroke Neg Hx     DRUG ALLERGIES:   Allergies  Allergen Reactions  . Vicodin [Hydrocodone-Acetaminophen] Other (See Comments)    Nose bleed    REVIEW OF SYSTEMS:  CONSTITUTIONAL: No fever, fatigue or weakness.  EYES: No blurred or double vision.  EARS, NOSE, AND THROAT: No tinnitus or ear  pain.  RESPIRATORY: No cough, shortness of breath, wheezing or hemoptysis.  CARDIOVASCULAR: No chest pain, orthopnea, edema.  GASTROINTESTINAL nausea, vomiting, epigastric pain.  GENITOURINARY: No dysuria, hematuria.  ENDOCRINE: No polyuria, nocturia,  HEMATOLOGY: No anemia, easy bruising or bleeding SKIN: No rash or lesion. MUSCULOSKELETAL: No joint pain or arthritis.  Complains of back pain. NEUROLOGIC: No tingling, numbness, weakness.  PSYCHIATRY: No anxiety or depression.   MEDICATIONS AT HOME:   Prior to Admission medications   Medication Sig Start Date End Date Taking? Authorizing Provider  amLODipine (NORVASC) 5 MG tablet Take 1 tablet (5 mg total) by mouth daily. 02/03/16  Yes Lada, Janit Bern, MD  escitalopram (LEXAPRO) 20 MG tablet Take 20 mg by mouth daily. 06/14/16  Yes [provider]  lisinopril (PRINIVIL,ZESTRIL) 20 MG tablet Take 1 tablet (20 mg total) by mouth daily. 02/03/16  Yes Lada, Janit Bern, MD  omeprazole (PRILOSEC) 20 MG capsule Take 1 capsule (20 mg total) by mouth 2 (two) times daily before a meal. If needed; long-term use carries risk; talk to your doctor Patient taking differently: Take 40 mg by mouth daily. If needed; long-term use carries risk; talk to your doctor 02/03/16  Yes Lada, Janit Bern, MD  XARELTO 20 MG TABS tablet TAKE 1 TABLET BY MOUTH ONCE DAILY WITH SUPPER 02/03/16  Yes Lada, Janit Bern, MD  famotidine (PEPCID) 40 MG tablet Take 1 tablet (40 mg total) by mouth every evening. Patient not taking: Reported on 04/12/2016 12/29/15 12/28/16  Jeanmarie Plant, MD  ondansetron (ZOFRAN) 4 MG tablet Take 1 tablet (4 mg total) by mouth every 8 (eight) hours as needed for nausea or vomiting. Patient not taking: Reported on 10/16/2016 12/29/15   Jeanmarie Plant, MD  traMADol (ULTRAM) 50 MG tablet Take 1 tablet (50 mg total) by mouth every 6 (six) hours as needed. Patient not taking: Reported on 05/01/2017 10/16/16 10/16/17  Emily Filbert, MD  traZODone (DESYREL) 50 MG tablet Take 1-2 tablets (50-100 mg total) by mouth at bedtime as needed for sleep. Patient not taking: Reported on 05/01/2017 08/12/15   Kerman Passey, MD  venlafaxine XR (EFFEXOR-XR) 150 MG 24 hr capsule Take 1 capsule (150 mg total) by mouth daily with breakfast. (this replaces Cymbalta / duloxetine) Patient not taking: Reported on 10/16/2016 08/18/15   Kerman Passey, MD      VITAL SIGNS:  Blood pressure (!) 154/94, pulse 61, temperature (!) 97.5 F (36.4 C), temperature source Oral, resp. rate 10, height 5\' 8"  (1.727 m), weight (!) 142.9 kg (315 lb), SpO2 97 %.  PHYSICAL EXAMINATION:  GENERAL:  42 y.o.-year-old patient lying in the bed with no acute distress.  EYES: Pupils equal, round,  reactive to light and accommodation. No scleral icterus. Extraocular muscles intact.  HEENT: Head atraumatic, normocephalic. Oropharynx and nasopharynx clear.  NECK:  Supple, no jugular venous distention. No thyroid enlargement, no tenderness.  LUNGS: Normal breath sounds bilaterally, no wheezing, rales,rhonchi or crepitation. No use of accessory muscles of respiration.  CARDIOVASCULAR: S1, S2 normal. No murmurs, rubs, or gallops.  ABDOMEN: Slight epigastric tenderness present no rebound tenderness, bowel sounds present.   EXTREMITIES: No pedal edema, cyanosis, or clubbing.  NEUROLOGIC: Cranial nerves II through XII are intact. Muscle strength 5/5 in all extremities. Sensation intact. Gait not checked.  PSYCHIATRIC: The patient is alert and oriented x 3.  SKIN: No obvious rash, lesion, or ulcer.   LABORATORY PANEL:   CBC Recent Labs  Lab 05/01/17 0537  WBC 11.7*  HGB 14.7  HCT 45.1  PLT 209   ------------------------------------------------------------------------------------------------------------------  Chemistries  Recent Labs  Lab 05/01/17 0537  NA 140  K 4.3  CL 104  CO2 26  GLUCOSE 110*  BUN 22*  CREATININE 0.95  CALCIUM 9.3   ------------------------------------------------------------------------------------------------------------------  Cardiac Enzymes Recent Labs  Lab 05/01/17 0537  TROPONINI <0.03   ------------------------------------------------------------------------------------------------------------------  RADIOLOGY:  Dg Chest 2 View  Result Date: 05/01/2017 CLINICAL DATA:  Chest pain. EXAM: CHEST - 2 VIEW COMPARISON:  Radiograph 10/16/2016.  CT 04/12/2016 FINDINGS: The cardiomediastinal contours are normal. Chronic scarring at the right lung base. Pulmonary vasculature is normal. No consolidation, pleural effusion, or pneumothorax. No acute osseous abnormalities are seen. IMPRESSION: Right basilar scarring.  No acute abnormality. Electronically  Signed   By: Rubye Oaks M.D.   On: 05/01/2017 05:31   Ct Angio Chest Pe W And/or Wo Contrast  Result Date: 05/01/2017 CLINICAL DATA:  Left side chest pain EXAM: CT ANGIOGRAPHY CHEST WITH CONTRAST TECHNIQUE: Multidetector CT imaging of the chest was performed using the standard protocol during bolus administration of intravenous contrast. Multiplanar CT image reconstructions and MIPs were obtained to evaluate the vascular anatomy. CONTRAST:  75mL ISOVUE-370 IOPAMIDOL (ISOVUE-370) INJECTION 76% COMPARISON:  Chest CT 04/12/2016 FINDINGS: Cardiovascular: Heart is enlarged. Aorta is normal caliber. No filling defects in the pulmonary arteries to suggest pulmonary emboli. Mediastinum/Nodes: No mediastinal, hilar, or axillary adenopathy. Small hiatal hernia. Lungs/Pleura: Small posterior left upper lobe nodule, 4 mm on image 17, stable since 2018. Scarring in the lung bases, stable. No effusions. Upper Abdomen: Imaging into the upper abdomen shows no acute findings. Gallstones within the gallbladder. Musculoskeletal: Chest wall soft tissues are unremarkable. No acute bony abnormality. Review of the MIP images confirms the above findings. IMPRESSION: No evidence of pulmonary embolus. Cardiomegaly. Posterior left upper lobe pulmonary nodule, 4 mm, stable. This could be followed with repeat CT in 1 year to ensure 2 years of stability. Bibasilar scarring. Small hiatal hernia. Cholelithiasis. Electronically Signed   By: Charlett Nose M.D.   On: 05/01/2017 09:48   US Abdomen Limited Ruq  Result Date: 05/01/2017 CLINICAL DATA:  Right upper quadrant pain with vomiting EXAM: ULTRASOUND ABDOMEN LIMITED RIGHT UPPER QUADRANT COMPARISON:  Abdominal CT 04/12/2016. Right upper quadrant ultrasound 04/12/2016 FINDINGS: Gallbladder: Multiple gallstones including 1.8 cm lodged in the neck. Gallbladder wall thickness measures 3-4 mm without striations. No pericholecystic edema. Patient is medicated, limiting sonographic Murphy  sign. Common bile duct: Diameter: 4 mm Liver: Increased liver echogenicity, probable mild steatosis. Portal vein is patent on color Doppler imaging with normal direction of blood flow towards the liver. IMPRESSION: 1. Multiple gallstones including stone fixed in the gallbladder neck. Limited Murphy sign due to analgesic administration, no pericholecystic inflammation to implicate acute cholecystitis. 2. Probable liver steatosis. Electronically Signed   By: Marnee Spring M.D.   On: 05/01/2017 07:18    EKG:   Orders placed or performed during the hospital encounter of 05/01/17  . EKG 12-Lead  . EKG 12-Lead  . ED EKG within 10 minutes  . ED EKG within 10 minutes  Normal sinus rhythm with no ST-T changes, rate 69 bpm.   IMPRESSION AND PLAN:  42 year old obese male with  history of chronic PE comes in with epigastric pain, nausea, vomiting. 1.  Epigastric pain, patient epigastric pain is concerning for GI etiology rather than cardiac source.  Because of his morbid obesity, history of PE , possible atypical chest pain admitting patient observation  status, cycle troponins 3 more times, patient will be on aspirin, small dose beta-blockers, nitrates, I will order echocardiogram. 2.  morbidObesity, epigastric pain, vomiting, gallstones seen on ultrasound.  Seen by Dr. Valaria GoodPabone from surgery does not think his pain is related to gallstones at this time.  He recommended HIDA scan ,I have ordered a HIDA scan.  If HIDA scan is positive patient will be transferred to surgical service.  Continue pain medicines, nausea medicines, PPIs.  Patient received multiple doses of morphine, Dilaudid, empiric Zosyn in the emergency room. Patient says that he has previously seen the surgeon couple of times to discuss gallbladder removal. 3.  Essential hypertension: Patient will be n.p.o. for HIDA scan.  Can take all his p.o. medicines after the HIDA scan.  Continue to monitor on telemetry.  4. history of depression.  Takes  venlafaxine.  All the records are reviewed and case discussed with ED provider. Management plans discussed with the patient, family and they are in agreement.  CODE STATUS: Full code  TOTAL TIME TAKING CARE OF THIS PATIENT:6955minutes.    Katha HammingSnehalatha Liba Hulsey M.D on 05/01/2017 at 11:51 AM  Between 7am to 6pm - Pager - [phone]  After 6pm go to [email or web address] - password [email or web address]  Fabio Neighbors[facility]  Office  [phone]  CC: Primary care physician; System, Pcp Not In  Note: This dictation was prepared with Dragon dictation along with smaller phrase technology. Any transcriptional errors that result from this process are unintentional.

## 2017-05-01 NOTE — Consult Note (Signed)
ANTICOAGULATION CONSULT NOTE - Initial Consult  Pharmacy Consult for Heparin Drip  Indication: pulmonary embolus and VTE prophylaxis  Allergies  Allergen Reactions  . Vicodin [Hydrocodone-Acetaminophen] Other (See Comments)    Nose bleed    Patient Measurements: Height: 5\' 8"  (172.7 cm) Weight: (!) 315 lb (142.9 kg) IBW/kg (Calculated) : 68.4 Heparin Dosing Weight: 102 kg  Vital Signs: Temp: 97.5 F (36.4 C) (03/06 0512) Temp Source: Oral (03/06 0512) BP: 145/85 (03/06 1230) Pulse Rate: 63 (03/06 1230)  Labs: Recent Labs    05/01/17 0537 05/01/17 1128  HGB 14.7  --   HCT 45.1  --   PLT 209  --   CREATININE 0.95  --   TROPONINI <0.03 <0.03    Estimated Creatinine Clearance: 140.7 mL/min (by C-G formula based on SCr of 0.95 mg/dL).   Medical History: Past Medical History:  Diagnosis Date  . Chronic pain   . Depression   . GERD (gastroesophageal reflux disease)   . Hypertension   . Morbid obesity (HCC)   . OSA (obstructive sleep apnea)    sleep study done 05/2012  . PE (pulmonary embolism) Nov 2013   bilateral following R leg injury  . 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 Peripheral neuropathy   . Personal history of pulmonary embolism 12/28/2011   bilateral following R leg injury     Assessment: Pharmacy consulted for heparin drip dosing and monitoring in 42 yo male with PMH of PE. Patient was taking Xarelto PTA, last dose 3/5 at 0700. Xarelto is being held while inpatient for possible surgery.   Goal of Therapy:  Heparin level 0.3-0.7 units/ml aPTT 66-102 seconds Monitor platelets by anticoagulation protocol: Yes   Plan:  Baseline labs ordered Heparin dosing weight 102kg Will not bolus at this time.   Start heparin infusion at 1550 units/hr  If baseline HL elevated, Will need to adjust using aPTT until HL and aPTT correlate. Check anti-Xa level and aPTT in 6 hours and daily while on heparin. Continue to monitor H&H and  platelets  Gardner CandleSheema M Lamira Borin, PharmD, BCPS Clinical Pharmacist 05/01/2017 1:05 PM

## 2017-05-01 NOTE — ED Provider Notes (Signed)
[facility] Emergency Department Provider Note   First MD Initiated Contact with Patient 05/01/17 0532     (approximate)  I have reviewed the triage vital signs and the nursing notes.   HISTORY  Chief Complaint Chest Pain and Emesis    HPI Jack Sullivan is a 42 y.o. male with below list of chronic medical conditions including cholelithiasis and pulmonary emboli (patient currently taking Xarelto has not missed any doses) Presents to the emergency department with 10 out of 10 upper abdominal discomfort associated with nausea and vomiting which began at 8:00 PM last night with acute worsening at 1 AM this morning.  Patient denies any diarrhea no fever.  Patient states last food ingestion was at 5 PM yesterday evening.   Past Medical History:  Diagnosis Date  . Chronic pain   . Depression   . GERD (gastroesophageal reflux disease)   . Hypertension   . Morbid obesity (HCC)   . OSA (obstructive sleep apnea)    sleep study done 05/2012  . PE (pulmonary embolism) Nov 2013   bilateral following R leg injury  . 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 Peripheral neuropathy   . Personal history of pulmonary embolism 12/28/2011   bilateral following R leg injury     Patient Active Problem List   Diagnosis Date Noted  . Epigastric abdominal pain 04/12/2016  . Epigastric pain   . Other specified diseases of esophagus   . Insomnia 07/17/2015  . Moderate major depression, single episode (HCC) 07/15/2015  . Fatigue 05/02/2015  . Morbid obesity (HCC)   . GERD (gastroesophageal reflux disease)   . Hypertension   . Peripheral neuropathy   . Chronic pain   . OSA (obstructive sleep apnea)   . Personal history of pulmonary embolism 12/28/2011    Past Surgical History:  Procedure Laterality Date  . CARDIOVASCULAR STRESS TEST  11/2012   normal  . ESOPHAGOGASTRODUODENOSCOPY (EGD) WITH PROPOFOL N/A 04/12/2016   Procedure:  ESOPHAGOGASTRODUODENOSCOPY (EGD) WITH PROPOFOL;  Surgeon: Midge Minium, MD;  Location: [facility] ENDOSCOPY;  Service: Endoscopy;  Laterality: N/A;  . HERNIA REPAIR      Prior to Admission medications   Medication Sig Start Date End Date Taking? Authorizing Provider  amLODipine (NORVASC) 5 MG tablet Take 1 tablet (5 mg total) by mouth daily. 02/03/16   Lada, Janit Bern, MD  escitalopram (LEXAPRO) 20 MG tablet Take 20 mg by mouth daily. 06/14/16   [provider]  famotidine (PEPCID) 40 MG tablet Take 1 tablet (40 mg total) by mouth every evening. Patient not taking: Reported on 04/12/2016 12/29/15 12/28/16  Jeanmarie Plant, MD  lisinopril (PRINIVIL,ZESTRIL) 20 MG tablet Take 1 tablet (20 mg total) by mouth daily. 02/03/16   Kerman Passey, MD  omeprazole (PRILOSEC) 20 MG capsule Take 1 capsule (20 mg total) by mouth 2 (two) times daily before a meal. If needed; long-term use carries risk; talk to your doctor Patient taking differently: Take 40 mg by mouth daily. If needed; long-term use carries risk; talk to your doctor 02/03/16   Kerman Passey, MD  ondansetron (ZOFRAN) 4 MG tablet Take 1 tablet (4 mg total) by mouth every 8 (eight) hours as needed for nausea or vomiting. Patient not taking: Reported on 10/16/2016 12/29/15   Jeanmarie Plant, MD  traMADol (ULTRAM) 50 MG tablet Take 1 tablet (50 mg total) by mouth every 6 (six) hours as needed. 10/16/16 10/16/17  Daryel November  E, MD  traZODone (DESYREL) 50 MG tablet Take 1-2 tablets (50-100 mg total) by mouth at bedtime as needed for sleep. 08/12/15   Kerman PasseyLada, Melinda P, MD  venlafaxine XR (EFFEXOR-XR) 150 MG 24 hr capsule Take 1 capsule (150 mg total) by mouth daily with breakfast. (this replaces Cymbalta / duloxetine) Patient not taking: Reported on 10/16/2016 08/18/15   Lada, Janit BernMelinda P, MD  XARELTO 20 MG TABS tablet TAKE 1 TABLET BY MOUTH ONCE DAILY WITH SUPPER Patient taking differently: TAKE 1 TABLET BY MOUTH ONCE DAILY 02/03/16   Lada, Janit BernMelinda P, MD     Allergies Vicodin [hydrocodone-acetaminophen]  Family History  Problem Relation Age of Onset  . Heart disease Father   . Hypertension Father   . Lung cancer Mother   . Diabetes Paternal Grandfather   . Heart disease Paternal Grandfather   . Hypertension Paternal Grandfather   . Cancer Neg Hx   . COPD Neg Hx   . Stroke Neg Hx     Social History Social History   Tobacco Use  . Smoking status: Former Smoker    Packs/day: 1.00    Years: 20.00    Pack years: 20.00    Types: Cigarettes    Last attempt to quit: 02/27/2008    Years since quitting: 9.1  . Smokeless tobacco: Never Used  Substance Use Topics  . Alcohol use: No  . Drug use: No    Review of Systems Constitutional: No fever/chills Eyes: No visual changes. ENT: No sore throat. Cardiovascular: Denies chest pain. Respiratory: Denies shortness of breath. Gastrointestinal: Positive for abdominal pain nausea and vomiting no diarrhea.  No constipation. Genitourinary: Negative for dysuria. Musculoskeletal: Negative for neck pain.  Negative for back pain. Integumentary: Negative for rash. Neurological: Negative for headaches, focal weakness or numbness.   ____________________________________________   PHYSICAL EXAM:  VITAL SIGNS: ED Triage Vitals  Enc Vitals Group     BP 05/01/17 0512 (!) 144/91     Pulse Rate 05/01/17 0512 68     Resp 05/01/17 0512 20     Temp 05/01/17 0512 (!) 97.5 F (36.4 C)     Temp Source 05/01/17 0512 Oral     SpO2 05/01/17 0512 98 %     Weight 05/01/17 0513 (!) 142.9 kg (315 lb)     Height 05/01/17 0513 1.727 m (5\' 8" )     Head Circumference --      Peak Flow --      Pain Score 05/01/17 0512 8     Pain Loc --      Pain Edu? --      Excl. in GC? --     Constitutional: Alert and oriented. Well appearing and in no acute distress. Eyes: Conjunctivae are normal.  Head: Atraumatic. Mouth/Throat: Mucous membranes are moist.  Oropharynx non-erythematous. Neck: No stridor.     Cardiovascular: Normal rate, regular rhythm. Good peripheral circulation. Grossly normal heart sounds. Respiratory: Normal respiratory effort.  No retractions. Lungs CTAB. Gastrointestinal: Upper quadrant/epigastric tenderness to palpation.. No distention.  Musculoskeletal: No lower extremity tenderness nor edema. No gross deformities of extremities. Neurologic:  Normal speech and language. No gross focal neurologic deficits are appreciated.  Skin:  Skin is warm, dry and intact. No rash noted. Psychiatric: Mood and affect are normal. Speech and behavior are normal.  ____________________________________________   LABS (all labs ordered are listed, but only abnormal results are displayed)  Labs Reviewed  CBC - Abnormal; Notable for the following components:  Result Value   WBC 11.7 (*)    All other components within normal limits  BASIC METABOLIC PANEL  TROPONIN I   ____________________________________________  EKG  ED ECG REPORT I, Villisca N Cono Gebhard, the attending physician, personally viewed and interpreted this ECG.   Date: 05/01/2017  EKG Time: 5:05 AM  Rate: 69  Rhythm: Normal sinus rhythm  Axis: Normal  Intervals: Normal  ST&T Change: None  ____________________________________________  RADIOLOGY I, [place] N Ransom Nickson, personally viewed and evaluated these images (plain radiographs) as part of my medical decision making, as well as reviewing the written report by the radiologist.  ED MD interpretation: No acute cardiopulmonary findings  Official radiology report(s): Dg Chest 2 View  Result Date: 05/01/2017 CLINICAL DATA:  Chest pain. EXAM: CHEST - 2 VIEW COMPARISON:  Radiograph 10/16/2016.  CT 04/12/2016 FINDINGS: The cardiomediastinal contours are normal. Chronic scarring at the right lung base. Pulmonary vasculature is normal. No consolidation, pleural effusion, or pneumothorax. No acute osseous abnormalities are seen. IMPRESSION: Right basilar scarring.  No acute  abnormality. Electronically Signed   By: Rubye Oaks M.D.   On: 05/01/2017 05:31     Procedures   ____________________________________________   INITIAL IMPRESSION / ASSESSMENT AND PLAN / ED COURSE  As part of my medical decision making, I reviewed the following data within the electronic MEDICAL RECORD NUMBER   42 year old male presented with above-stated history and physical exam concerning for possible cholelithiasis/cholecystitis.  Patient given IV morphine 4 mg and Zofran 4 mg with some improvement of pain however pain persisted and as such patient received an additional 4 mg of IV morphine.  Right upper quadrant ultrasound pending at this time with concern for cholecystitis versus cholelithiasis ____________________________________________  FINAL CLINICAL IMPRESSION(S) / ED DIAGNOSES  Final diagnoses:  Vomiting  RUQ pain     MEDICATIONS GIVEN DURING THIS VISIT:  Medications  ondansetron (ZOFRAN) injection 4 mg (4 mg Intravenous Given 05/01/17 0542)  morphine 4 MG/ML injection 4 mg (4 mg Intravenous Given 05/01/17 0542)     ED Discharge Orders    None       Note:  This document was prepared using Dragon voice recognition software and may include unintentional dictation errors.    Darci Current, MD 05/01/17 [phone]

## 2017-05-01 NOTE — ED Notes (Signed)
Attempted report; was told nurse would call back. 

## 2017-05-01 NOTE — ED Provider Notes (Addendum)
CT angiogram is negative for PE.  This test was recommended by general surgery.  She is still having chest pain although it is improving.  I will discussed with the hospitalist for admission.   Emily FilbertWilliams, Jonathan E, MD 05/01/17 [id number]    Emily FilbertWilliams, Jonathan E, MD 05/01/17 1038

## 2017-05-01 NOTE — Consult Note (Signed)
Patient ID: ALERIC FROELICH, male   DOB: 1975/11/07, 42 y.o.   MRN: [id number]  HPI Dwane A Bendickson is a 42 y.o. male asked to see in consultation by Dr. Mayford Knife.  He is 42 year old male morbidly obese with a history of PE and is currently anticoagulated on Xarelto.  Comes into the emergency room with chest pain epigastric and left upper quadrant pain. He reports the pain is constant, severe and radiated to his back.  No fevers no chills.  Some vomiting, no biliary obstruction. Ultrasound personally reviewed showing evidence of cholelithiasis without cholecystitis.  Slightly increased white count. Nml creat.  CT angios shows no evidence of PE and I have personally reviewed there is no evidence of acute cholecystitis either  HPI  Past Medical History:  Diagnosis Date  . Chronic pain   . Depression   . GERD (gastroesophageal reflux disease)   . Hypertension   . Morbid obesity (HCC)   . OSA (obstructive sleep apnea)    sleep study done 05/2012  . PE (pulmonary embolism) Nov 2013   bilateral following R leg injury  . 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 Peripheral neuropathy   . Personal history of pulmonary embolism 12/28/2011   bilateral following R leg injury     Past Surgical History:  Procedure Laterality Date  . CARDIOVASCULAR STRESS TEST  11/2012   normal  . ESOPHAGOGASTRODUODENOSCOPY (EGD) WITH PROPOFOL N/A 04/12/2016   Procedure: ESOPHAGOGASTRODUODENOSCOPY (EGD) WITH PROPOFOL;  Surgeon: Midge Minium, MD;  Location: [facility] ENDOSCOPY;  Service: Endoscopy;  Laterality: N/A;  . HERNIA REPAIR      Family History  Problem Relation Age of Onset  . Heart disease Father   . Hypertension Father   . Lung cancer Mother   . Diabetes Paternal Grandfather   . Heart disease Paternal Grandfather   . Hypertension Paternal Grandfather   . Cancer Neg Hx   . COPD Neg Hx   . Stroke Neg Hx     Social History Social History   Tobacco Use  . Smoking status: Former  Smoker    Packs/day: 1.00    Years: 20.00    Pack years: 20.00    Types: Cigarettes    Last attempt to quit: 02/27/2008    Years since quitting: 9.1  . Smokeless tobacco: Never Used  Substance Use Topics  . Alcohol use: No  . Drug use: No    Allergies  Allergen Reactions  . Vicodin [Hydrocodone-Acetaminophen] Other (See Comments)    Nose bleed    No current facility-administered medications for this encounter.    Current Outpatient Medications  Medication Sig Dispense Refill  . amLODipine (NORVASC) 5 MG tablet Take 1 tablet (5 mg total) by mouth daily. 90 tablet 1  . escitalopram (LEXAPRO) 20 MG tablet Take 20 mg by mouth daily.    Marland Kitchen lisinopril (PRINIVIL,ZESTRIL) 20 MG tablet Take 1 tablet (20 mg total) by mouth daily. 90 tablet 1  . omeprazole (PRILOSEC) 20 MG capsule Take 1 capsule (20 mg total) by mouth 2 (two) times daily before a meal. If needed; long-term use carries risk; talk to your doctor (Patient taking differently: Take 40 mg by mouth daily. If needed; long-term use carries risk; talk to your doctor) 60 capsule 0  . XARELTO 20 MG TABS tablet TAKE 1 TABLET BY MOUTH ONCE DAILY WITH SUPPER 90 tablet 1  . famotidine (PEPCID) 40 MG tablet Take 1 tablet (40 mg total) by mouth every evening. (  Patient not taking: Reported on 04/12/2016) 30 tablet 1  . ondansetron (ZOFRAN) 4 MG tablet Take 1 tablet (4 mg total) by mouth every 8 (eight) hours as needed for nausea or vomiting. (Patient not taking: Reported on 10/16/2016) 8 tablet 0  . traMADol (ULTRAM) 50 MG tablet Take 1 tablet (50 mg total) by mouth every 6 (six) hours as needed. (Patient not taking: Reported on 05/01/2017) 20 tablet 0  . traZODone (DESYREL) 50 MG tablet Take 1-2 tablets (50-100 mg total) by mouth at bedtime as needed for sleep. (Patient not taking: Reported on 05/01/2017) 60 tablet 3  . venlafaxine XR (EFFEXOR-XR) 150 MG 24 hr capsule Take 1 capsule (150 mg total) by mouth daily with breakfast. (this replaces Cymbalta /  duloxetine) (Patient not taking: Reported on 10/16/2016) 30 capsule 2     Review of Systems Full ROS  was asked and was negative except for the information on the HPI  Physical Exam Blood pressure (!) 175/95, pulse 75, temperature (!) 97.5 F (36.4 C), temperature source Oral, resp. rate 14, height 5\' 8"  (1.727 m), weight (!) 142.9 kg (315 lb), SpO2 100 %. CONSTITUTIONAL: Obese male in pain EYES: Pupils are equal, round, and reactive to light, Sclera are non-icteric. EARS, NOSE, MOUTH AND THROAT: The oropharynx is clear. The oral mucosa is pink and moist. Hearing is intact to voice. LYMPH NODES:  Lymph nodes in the neck are normal. RESPIRATORY:  Lungs are clear. There is normal respiratory effort, with equal breath sounds bilaterally, and without pathologic use of accessory muscles. CARDIOVASCULAR: Heart is regular without murmurs, gallops, or rubs. GI: The abdomen is  soft, Mild tenderness on epigastric area and Left chest wall. No peritonitis or murphy GU: Rectal deferred.   MUSCULOSKELETAL: Normal muscle strength and tone. No cyanosis or edema.   SKIN: Turgor is good and there are no pathologic skin lesions or ulcers. NEUROLOGIC: Motor and sensation is grossly normal. Cranial nerves are grossly intact. PSYCH:  Oriented to person, place and time. Affect is normal.  Data Reviewed  I have personally reviewed the patient's imaging, laboratory findings and medical records.    Assessment Plan   42 year old morbidly obese male with a history of PE now with chest pain and epigastric pain.  Currently not classic for acute cholecystitis.  My concern would be that this patient may be having another PE or even some myocardial ischemia.  Preliminary EKG and troponins were negative.  Because of his multiple risk factors and the current unknown situation from his PE or myocardial ischemia I would suggest admission to the hospitalist until this has been sorted out.  I will also suggest transition to  a heparin drip and holding the Xarelto.  I will obtain a HIDA scan in the interim to exclude acute cholecystitis and if that were the case he will need a laparoscopic cholecystectomy after his heart is clear. I have d/w the Hospitalist in detail. They will admit to their service and if in fact HIDA scan is positive I will be happy to take over.  Sterling Bigiego Santos Sollenberger, MD FACS General Surgeon 05/01/2017, 9:38 AM

## 2017-05-01 NOTE — ED Notes (Signed)
Surgeon at bedside.  

## 2017-05-01 NOTE — Plan of Care (Signed)
Abdominal pain relieved with prn IV meds.

## 2017-05-01 NOTE — Consult Note (Signed)
ANTICOAGULATION CONSULT NOTE - Initial Consult  Pharmacy Consult for Heparin Drip  Indication: pulmonary embolus and VTE prophylaxis  Allergies  Allergen Reactions  . Vicodin [Hydrocodone-Acetaminophen] Other (See Comments)    Nose bleed    Patient Measurements: Height: 5\' 8"  (172.7 cm) Weight: (!) 315 lb (142.9 kg) IBW/kg (Calculated) : 68.4 Heparin Dosing Weight: 102 kg  Vital Signs: Temp: 98.3 F (36.8 C) (03/06 1509) BP: 123/67 (03/06 1852) Pulse Rate: 70 (03/06 1509)  Labs: Recent Labs    05/01/17 0537 05/01/17 1128 05/01/17 1301 05/01/17 1925  HGB 14.7  --   --   --   HCT 45.1  --   --   --   PLT 209  --   --   --   APTT  --   --  28 50*  LABPROT  --   --  13.4  --   INR  --   --  1.03  --   HEPARINUNFRC  --   --  0.32 0.60  CREATININE 0.95  --   --   --   TROPONINI <0.03 <0.03  --   --     Estimated Creatinine Clearance: 140.7 mL/min (by C-G formula based on SCr of 0.95 mg/dL).   Medical History: Past Medical History:  Diagnosis Date  . Chronic pain   . Depression   . GERD (gastroesophageal reflux disease)   . Hypertension   . Morbid obesity (HCC)   . OSA (obstructive sleep apnea)    sleep study done 05/2012  . PE (pulmonary embolism) Nov 2013   bilateral following R leg injury  . 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 Peripheral neuropathy   . Personal history of pulmonary embolism 12/28/2011   bilateral following R leg injury     Assessment: Pharmacy consulted for heparin drip dosing and monitoring in 42 yo male with PMH of PE. Patient was taking Xarelto PTA, last dose 3/5 at 0700. Xarelto is being held while inpatient for possible surgery.   Goal of Therapy:  Heparin level 0.3-0.7 units/ml aPTT 66-102 seconds Monitor platelets by anticoagulation protocol: Yes   Plan:  Heparin dosing weight 102kg  3/6 1925 HL therapeutic at 0.60. Will continue current infusion rate of 1550 units/hr. Will recheck HL in 6 hours.   Continue to monitor H&H and platelets  Gardner CandleSheema M Riannah Stagner, PharmD, BCPS Clinical Pharmacist 05/01/2017 7:50 PM

## 2017-05-01 NOTE — ED Triage Notes (Signed)
Pt presents to ED with c/o mid chest pain that radiates around his left side to his back. Onset of his pain was around 2000 but worsened around 0100 this morning. Hx of PE's and gallstones; has seen surgeon to discuss having gallbladder removed. Pt reports vomiting a "couple of times". States current pain feels different than any previous pain.

## 2017-05-02 DIAGNOSIS — K429 Umbilical hernia without obstruction or gangrene: Secondary | ICD-10-CM

## 2017-05-02 LAB — AMYLASE: Amylase: 49 U/L (ref 28–100)

## 2017-05-02 LAB — COMPREHENSIVE METABOLIC PANEL
ALK PHOS: 106 U/L (ref 38–126)
ALT: 92 U/L — AB (ref 17–63)
AST: 62 U/L — ABNORMAL HIGH (ref 15–41)
Albumin: 3.7 g/dL (ref 3.5–5.0)
Anion gap: 7 (ref 5–15)
BILIRUBIN TOTAL: 1.2 mg/dL (ref 0.3–1.2)
BUN: 19 mg/dL (ref 6–20)
CALCIUM: 8.7 mg/dL — AB (ref 8.9–10.3)
CO2: 28 mmol/L (ref 22–32)
CREATININE: 0.9 mg/dL (ref 0.61–1.24)
Chloride: 104 mmol/L (ref 101–111)
Glucose, Bld: 100 mg/dL — ABNORMAL HIGH (ref 65–99)
Potassium: 4 mmol/L (ref 3.5–5.1)
Sodium: 139 mmol/L (ref 135–145)
TOTAL PROTEIN: 7.1 g/dL (ref 6.5–8.1)

## 2017-05-02 LAB — ECHOCARDIOGRAM COMPLETE
Height: 68 in
Weight: 5040 oz

## 2017-05-02 LAB — CBC
HEMATOCRIT: 42.7 % (ref 40.0–52.0)
Hemoglobin: 14.1 g/dL (ref 13.0–18.0)
MCH: 28.7 pg (ref 26.0–34.0)
MCHC: 33.1 g/dL (ref 32.0–36.0)
MCV: 86.6 fL (ref 80.0–100.0)
PLATELETS: 204 10*3/uL (ref 150–440)
RBC: 4.93 MIL/uL (ref 4.40–5.90)
RDW: 13.9 % (ref 11.5–14.5)
WBC: 10.3 10*3/uL (ref 3.8–10.6)

## 2017-05-02 LAB — LIPASE, BLOOD: LIPASE: 31 U/L (ref 11–51)

## 2017-05-02 LAB — HEPARIN LEVEL (UNFRACTIONATED): Heparin Unfractionated: 0.54 IU/mL (ref 0.30–0.70)

## 2017-05-02 MED ORDER — MORPHINE SULFATE (PF) 2 MG/ML IV SOLN
2.0000 mg | INTRAVENOUS | Status: DC | PRN
  Administered 2017-05-02 (×2): 2 mg via INTRAVENOUS
  Filled 2017-05-02 (×2): qty 1

## 2017-05-02 MED ORDER — PIPERACILLIN-TAZOBACTAM 3.375 G IVPB
3.3750 g | Freq: Three times a day (TID) | INTRAVENOUS | Status: DC
Start: 2017-05-02 — End: 2017-05-04
  Administered 2017-05-02 – 2017-05-04 (×6): 3.375 g via INTRAVENOUS
  Filled 2017-05-02 (×6): qty 50

## 2017-05-02 MED ORDER — OXYCODONE-ACETAMINOPHEN 10-325 MG PO TABS: 2.0000 | ORAL_TABLET | ORAL | 0 refills | Status: DC | PRN

## 2017-05-02 MED ORDER — KETOROLAC TROMETHAMINE 30 MG/ML IJ SOLN
30.0000 mg | Freq: Four times a day (QID) | INTRAMUSCULAR | Status: DC | PRN
  Administered 2017-05-02 – 2017-05-03 (×4): 30 mg via INTRAVENOUS
  Filled 2017-05-02 (×5): qty 1

## 2017-05-02 MED ORDER — GABAPENTIN 300 MG PO CAPS: 300.0000 mg | ORAL_CAPSULE | Freq: Four times a day (QID) | ORAL | 1 refills | Status: DC

## 2017-05-02 MED ORDER — HEPARIN (PORCINE) IN NACL 100-0.45 UNIT/ML-% IJ SOLN
1550.0000 [IU]/h | INTRAMUSCULAR | Status: DC
  Administered 2017-05-02: 1550 [IU]/h via INTRAVENOUS
  Filled 2017-05-02: qty 250

## 2017-05-02 MED ORDER — CYCLOBENZAPRINE HCL 5 MG PO TABS: 5.0000 mg | ORAL_TABLET | Freq: Three times a day (TID) | ORAL | 0 refills | Status: DC

## 2017-05-02 MED ORDER — MORPHINE SULFATE (PF) 4 MG/ML IV SOLN
4.0000 mg | INTRAVENOUS | Status: DC | PRN
  Administered 2017-05-02 – 2017-05-03 (×4): 4 mg via INTRAVENOUS
  Filled 2017-05-02 (×4): qty 1

## 2017-05-02 NOTE — Plan of Care (Signed)
Abdominal pain controlled with prn pain meds.

## 2017-05-02 NOTE — Consult Note (Signed)
ANTICOAGULATION CONSULT NOTE - Initial Consult  Pharmacy Consult for Heparin Drip  Indication: pulmonary embolus and VTE prophylaxis  Allergies  Allergen Reactions  . Vicodin [Hydrocodone-Acetaminophen] Other (See Comments)    Nose bleed    Patient Measurements: Height: 5\' 8"  (172.7 cm) Weight: (!) 315 lb (142.9 kg) IBW/kg (Calculated) : 68.4 Heparin Dosing Weight: 102 kg  Vital Signs: Temp: 98.6 F (37 C) (03/06 2011) Temp Source: Oral (03/06 2011) BP: 126/68 (03/06 2011) Pulse Rate: 65 (03/06 2011)  Labs: Recent Labs    05/01/17 0537 05/01/17 1128 05/01/17 1301 05/01/17 1925 05/02/17 0138  HGB 14.7  --   --   --  14.1  HCT 45.1  --   --   --  42.7  PLT 209  --   --   --  204  APTT  --   --  28 50*  --   LABPROT  --   --  13.4  --   --   INR  --   --  1.03  --   --   HEPARINUNFRC  --   --  0.32 0.60 0.54  CREATININE 0.95  --   --   --  0.90  TROPONINI <0.03 <0.03  --   --   --     Estimated Creatinine Clearance: 148.5 mL/min (by C-G formula based on SCr of 0.9 mg/dL).   Medical History: Past Medical History:  Diagnosis Date  . Chronic pain   . Depression   . GERD (gastroesophageal reflux disease)   . Hypertension   . Morbid obesity (HCC)   . OSA (obstructive sleep apnea)    sleep study done 05/2012  . PE (pulmonary embolism) Nov 2013   bilateral following R leg injury  . 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 Peripheral neuropathy   . Personal history of pulmonary embolism 12/28/2011   bilateral following R leg injury     Assessment: Pharmacy consulted for heparin drip dosing and monitoring in 42 yo male with PMH of PE. Patient was taking Xarelto PTA, last dose 3/5 at 0700. Xarelto is being held while inpatient for possible surgery.   Goal of Therapy:  Heparin level 0.3-0.7 units/ml aPTT 66-102 seconds Monitor platelets by anticoagulation protocol: Yes   Plan:  Heparin dosing weight 102kg  3/6 1925 HL therapeutic at  0.60. Will continue current infusion rate of 1550 units/hr. Will recheck HL in 6 hours.  Continue to monitor H&H and platelets  03/08 0130 heparin level 0.54. Continue current regimen. Recheck heparin level and CBC with tomorrow AM labs.  Erich MontaneMcBane,Gracy Ehly S, PharmD, BCPS Clinical Pharmacist 05/02/2017 3:39 AM

## 2017-05-02 NOTE — Progress Notes (Signed)
[employer] - [place] [facility]   PATIENT NAME: Jack MallickJoey Sullivan    MR#:  [id number]  DATE OF BIRTH:  10-13-1975  SUBJECTIVE:   Patient still complaining of pain  REVIEW OF SYSTEMS:    Review of Systems  Constitutional: Negative for fever, chills weight loss HENT: Negative for ear pain, nosebleeds, congestion, facial swelling, rhinorrhea, neck pain, neck stiffness and ear discharge.   Respiratory: Negative for cough, shortness of breath, wheezing  Cardiovascular: Negative for chest pain, palpitations and leg swelling.  Gastrointestinal: Negative for heartburn, positive abdominal pain, no vomiting, diarrhea or consitpation Genitourinary: Negative for dysuria, urgency, frequency, hematuria Musculoskeletal: Negative for back pain or joint pain Neurological: Negative for dizziness, seizures, syncope, focal weakness,  numbness and headaches.  Hematological: Does not bruise/bleed easily.  Psychiatric/Behavioral: Negative for hallucinations, confusion, dysphoric mood    Tolerating Diet: yes      DRUG ALLERGIES:   Allergies  Allergen Reactions  . Vicodin [Hydrocodone-Acetaminophen] Other (See Comments)    Nose bleed    VITALS:  Blood pressure 114/66, pulse 66, temperature 97.7 F (36.5 C), temperature source Oral, resp. rate 18, height 5\' 8"  (1.727 m), weight (!) 144 kg (317 lb 6.4 oz), SpO2 97 %.  PHYSICAL EXAMINATION:  Constitutional: Appears well-developed and well-nourished. No distress. HENT: Normocephalic. Marland Kitchen. Oropharynx is clear and moist.  Eyes: Conjunctivae and EOM are normal. PERRLA, no scleral icterus.  Neck: Normal ROM. Neck supple. No JVD. No tracheal deviation. CVS: RRR, S1/S2 +, no murmurs, no gallops, no carotid bruit.  Pulmonary: Effort and breath sounds normal, no stridor, rhonchi, wheezes, rales.  Abdominal: Right upper quadrant tenderness  Musculoskeletal: Normal range of motion. No edema and no tenderness.  Neuro: Alert. CN 2-12 grossly  intact. No focal deficits. Skin: Skin is warm and dry. No rash noted. Psychiatric: Normal mood and affect.      LABORATORY PANEL:   CBC Recent Labs  Lab 05/02/17 0138  WBC 10.3  HGB 14.1  HCT 42.7  PLT 204   ------------------------------------------------------------------------------------------------------------------  Chemistries  Recent Labs  Lab 05/02/17 0138  NA 139  K 4.0  CL 104  CO2 28  GLUCOSE 100*  BUN 19  CREATININE 0.90  CALCIUM 8.7*  AST 62*  ALT 92*  ALKPHOS 106  BILITOT 1.2   ------------------------------------------------------------------------------------------------------------------  Cardiac Enzymes Recent Labs  Lab 05/01/17 0537 05/01/17 1128  TROPONINI <0.03 <0.03   ------------------------------------------------------------------------------------------------------------------  RADIOLOGY:  Dg Chest 2 View  Result Date: 05/01/2017 CLINICAL DATA:  Chest pain. EXAM: CHEST - 2 VIEW COMPARISON:  Radiograph 10/16/2016.  CT 04/12/2016 FINDINGS: The cardiomediastinal contours are normal. Chronic scarring at the right lung base. Pulmonary vasculature is normal. No consolidation, pleural effusion, or pneumothorax. No acute osseous abnormalities are seen. IMPRESSION: Right basilar scarring.  No acute abnormality. Electronically Signed   By: Rubye OaksMelanie  Ehinger M.D.   On: 05/01/2017 05:31   Ct Angio Chest Pe W And/or Wo Contrast  Result Date: 05/01/2017 CLINICAL DATA:  Left side chest pain EXAM: CT ANGIOGRAPHY CHEST WITH CONTRAST TECHNIQUE: Multidetector CT imaging of the chest was performed using the standard protocol during bolus administration of intravenous contrast. Multiplanar CT image reconstructions and MIPs were obtained to evaluate the vascular anatomy. CONTRAST:  75mL ISOVUE-370 IOPAMIDOL (ISOVUE-370) INJECTION 76% COMPARISON:  Chest CT 04/12/2016 FINDINGS: Cardiovascular: Heart is enlarged. Aorta is normal caliber. No filling defects in the  pulmonary arteries to suggest pulmonary emboli. Mediastinum/Nodes: No mediastinal, hilar, or axillary adenopathy. Small hiatal hernia. Lungs/Pleura: Small posterior  left upper lobe nodule, 4 mm on image 17, stable since 2018. Scarring in the lung bases, stable. No effusions. Upper Abdomen: Imaging into the upper abdomen shows no acute findings. Gallstones within the gallbladder. Musculoskeletal: Chest wall soft tissues are unremarkable. No acute bony abnormality. Review of the MIP images confirms the above findings. IMPRESSION: No evidence of pulmonary embolus. Cardiomegaly. Posterior left upper lobe pulmonary nodule, 4 mm, stable. This could be followed with repeat CT in 1 year to ensure 2 years of stability. Bibasilar scarring. Small hiatal hernia. Cholelithiasis. Electronically Signed   By: Charlett Nose M.D.   On: 05/01/2017 09:48   Nm Hepatobiliary Liver Func  Result Date: 05/01/2017 CLINICAL DATA:  42 year old male with right upper quadrant pain, nausea and vomiting EXAM: NUCLEAR MEDICINE HEPATOBILIARY IMAGING TECHNIQUE: Sequential images of the abdomen were obtained out to 60 minutes following intravenous administration of radiopharmaceutical. RADIOPHARMACEUTICALS:  5.23 mCi Tc-47m Choletec IV. Additionally, 5.7 mg of morphine was administered after 90 minutes of nonvisualization of the gallbladder COMPARISON:  None. FINDINGS: Prompt uptake and biliary excretion of activity by the liver is seen. Biliary activity passes into small bowel, consistent with patent common bile duct. Definitive visualization of the gallbladder is not apparent. There is a small round focus of radiotracer accumulation in the region of the porta hepatis which may represent a small amount of radiotracer in the gallbladder neck. IMPRESSION: 1. Nonvisualization of the majority of the gallbladder. There is a small round region of radiotracer uptake in the region of the porta hepatis which may represent the gallbladder neck following  correlation with prior CT imaging. There is a large stone in this region which may be obstructing the remainder of the gallbladder lumen. Findings are suggestive of acute cholecystitis in the appropriate clinical setting. 2. Patent common bile duct. Electronically Signed   By: Malachy Moan M.D.   On: 05/01/2017 18:39   US Abdomen Limited Ruq  Result Date: 05/01/2017 CLINICAL DATA:  Right upper quadrant pain with vomiting EXAM: ULTRASOUND ABDOMEN LIMITED RIGHT UPPER QUADRANT COMPARISON:  Abdominal CT 04/12/2016. Right upper quadrant ultrasound 04/12/2016 FINDINGS: Gallbladder: Multiple gallstones including 1.8 cm lodged in the neck. Gallbladder wall thickness measures 3-4 mm without striations. No pericholecystic edema. Patient is medicated, limiting sonographic Murphy sign. Common bile duct: Diameter: 4 mm Liver: Increased liver echogenicity, probable mild steatosis. Portal vein is patent on color Doppler imaging with normal direction of blood flow towards the liver. IMPRESSION: 1. Multiple gallstones including stone fixed in the gallbladder neck. Limited Murphy sign due to analgesic administration, no pericholecystic inflammation to implicate acute cholecystitis. 2. Probable liver steatosis. Electronically Signed   By: Marnee Spring M.D.   On: 05/01/2017 07:18     ASSESSMENT AND PLAN:   42 year old male with history of pulmonary emboli on anticoagulation who presents with abdominal pain  1. Acute cholecystitis Continue Zosyn Plan for cholecystectomy in a.m. I have increased morphine to 4 mg for better pain control   2. History of PE: Continue heparin and transitioned to oral anticoagulation after surgery  3. Essential hypertension: Continue Norvasc and metoprolol and lisinopril.   I will sign off. Please, if he any further questions. Plan of care discussed with Dr. Everlene Farrier  Management plans discussed with the patient and he is in agreement.  CODE STATUS: full  TOTAL TIME TAKING CARE  OF THIS PATIENT: 30 minutes.     POSSIBLE D/C 2-4 days, DEPENDING ON CLINICAL CONDITION.   Elantra Caprara M.D on 05/02/2017 at 10:03 AM  Between 7am to 6pm - Pager - [phone] After 6pm go to [email or web address] - password [email or web address]  [facility]  Office  [phone]  CC: Primary care physician; System, Pcp Not In  Note: This dictation was prepared with Dragon dictation along with smaller phrase technology. Any transcriptional errors that result from this process are unintentional.

## 2017-05-02 NOTE — Op Note (Deleted)
Robotic assisted Umbilical Hernia Repair w Ventralight 11 cm Round Mesh  Pre-operative Diagnosis: Large UH  Post-operative Diagnosis: Same  Surgeon: Marcellis Frampton, MD FACS  Anesthesia: Gen. 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 The benefits, complications, treatment options, and expected outcomes were discussed with the patient. The risks of bleeding, infection, recurrence of symptoms, failure to resolve symptoms, bowel injury, mesh placement, mesh infection, any of which could require further surgery were reviewed with the patient. The likelihood of improving the patient's symptoms with return to their baseline status is good.  The patient and/or family concurred with the proposed plan, giving informed consent.  The patient was taken to Operating Room, identified as Jack Sullivan and the procedure verified.  A Time Out was held and the above information confirmed.  Prior to the induction of general anesthesia, antibiotic prophylaxis was administered. VTE prophylaxis was in place. General endotracheal anesthesia was then administered and tolerated well. After the induction, the abdomen was prepped with Chloraprep and draped in the sterile fashion. The patient was positioned in the supine position.  Incision was created with a scalpel  Below the left costal margin. Using an optiview the abdominal cavity was entered under direct visualization and pneumoperitoneum was established. Additional 12 mm was placed on RLQ and 8 mm port was placed on right flank all under direct vision. The robotics arms were docked appropriately avoiding any collision or interference.  The omentum and the sac were reduced in the standard fashion. The hernia was measured and the  mesh was selected.  Using V loc 2 -0 we closed the defect in the standard fashion  The mesh was placed in an underlay fashion using the echo location.  Suture passer was used to position the mesh. Using 2-0 v loc we secure the mesh to the abdominal wall circumferentially.  The mesh lay really nicely against the abdominal wall.  The hernia support was removed under direct visualization as well as all the needles.  The laparoscopic ports were removed under direct visualization and the pneumoperitoneum was deflated.  Incisions were closed with a  4-0 Monocryl. Dermabond was used to coat the skin. Marcaine quarter percent with epinephrine and lidocaine 1% was used to inject all the incision sites.   Patient tolerated procedure well and there were no immediate complications. Needle and laparotomy counts were correct   [name], MD, FACS   

## 2017-05-02 NOTE — Progress Notes (Signed)
CC:  Subjective:  Continues to have pain,  HIDA reviewed No evidence of myoc ischemia   Objective: Vital signs in last 24 hours: Temp:  [97.7 F (36.5 C)-98.6 F (37 C)] 97.7 F (36.5 C) (03/07 0831) Pulse Rate:  [65-66] 66 (03/07 0831) Resp:  [17-18] 18 (03/07 0831) BP: (114-136)/(66-76) 114/66 (03/07 0831) SpO2:  [95 %-97 %] 97 % (03/07 0831) Weight:  [144 kg (317 lb 6.4 oz)] 144 kg (317 lb 6.4 oz) (03/07 0347) Last BM Date: 04/30/17  Intake/Output from previous day: 03/06 0701 - 03/07 0700 In: 1028.4 [I.V.:978.4; IV Piggyback:50] Out: 0  Intake/Output this shift: Total I/O In: 120 [P.O.:120] Out: -   Physical exam: NAD, obese Ab: soft, mild tenderness epigastric area, no peritonitis Ext: no edema and well perfused   Lab Results: CBC  Recent Labs    05/01/17 0537 05/02/17 0138  WBC 11.7* 10.3  HGB 14.7 14.1  HCT 45.1 42.7  PLT 209 204   BMET Recent Labs    05/01/17 0537 05/02/17 0138  NA 140 139  K 4.3 4.0  CL 104 104  CO2 26 28  GLUCOSE 110* 100*  BUN 22* 19  CREATININE 0.95 0.90  CALCIUM 9.3 8.7*   PT/INR Recent Labs    05/01/17 1301  LABPROT 13.4  INR 1.03   ABG No results for input(s): PHART, HCO3 in the last 72 hours.  Invalid input(s): PCO2, PO2  Studies/Results: Dg Chest 2 View  Result Date: 05/01/2017 CLINICAL DATA:  Chest pain. EXAM: CHEST - 2 VIEW COMPARISON:  Radiograph 10/16/2016.  CT 04/12/2016 FINDINGS: The cardiomediastinal contours are normal. Chronic scarring at the right lung base. Pulmonary vasculature is normal. No consolidation, pleural effusion, or pneumothorax. No acute osseous abnormalities are seen. IMPRESSION: Right basilar scarring.  No acute abnormality. Electronically Signed   By: Rubye OaksMelanie  Ehinger M.D.   On: 05/01/2017 05:31   Ct Angio Chest Pe W And/or Wo Contrast  Result Date: 05/01/2017 CLINICAL DATA:  Left side chest pain EXAM: CT ANGIOGRAPHY CHEST WITH CONTRAST TECHNIQUE: Multidetector CT imaging of the  chest was performed using the standard protocol during bolus administration of intravenous contrast. Multiplanar CT image reconstructions and MIPs were obtained to evaluate the vascular anatomy. CONTRAST:  75mL ISOVUE-370 IOPAMIDOL (ISOVUE-370) INJECTION 76% COMPARISON:  Chest CT 04/12/2016 FINDINGS: Cardiovascular: Heart is enlarged. Aorta is normal caliber. No filling defects in the pulmonary arteries to suggest pulmonary emboli. Mediastinum/Nodes: No mediastinal, hilar, or axillary adenopathy. Small hiatal hernia. Lungs/Pleura: Small posterior left upper lobe nodule, 4 mm on image 17, stable since 2018. Scarring in the lung bases, stable. No effusions. Upper Abdomen: Imaging into the upper abdomen shows no acute findings. Gallstones within the gallbladder. Musculoskeletal: Chest wall soft tissues are unremarkable. No acute bony abnormality. Review of the MIP images confirms the above findings. IMPRESSION: No evidence of pulmonary embolus. Cardiomegaly. Posterior left upper lobe pulmonary nodule, 4 mm, stable. This could be followed with repeat CT in 1 year to ensure 2 years of stability. Bibasilar scarring. Small hiatal hernia. Cholelithiasis. Electronically Signed   By: Charlett NoseKevin  Dover M.D.   On: 05/01/2017 09:48   Nm Hepatobiliary Liver Func  Result Date: 05/01/2017 CLINICAL DATA:  42 year old male with right upper quadrant pain, nausea and vomiting EXAM: NUCLEAR MEDICINE HEPATOBILIARY IMAGING TECHNIQUE: Sequential images of the abdomen were obtained out to 60 minutes following intravenous administration of radiopharmaceutical. RADIOPHARMACEUTICALS:  5.23 mCi Tc-193m Choletec IV. Additionally, 5.7 mg of morphine was administered after 90 minutes of nonvisualization  of the gallbladder COMPARISON:  None. FINDINGS: Prompt uptake and biliary excretion of activity by the liver is seen. Biliary activity passes into small bowel, consistent with patent common bile duct. Definitive visualization of the gallbladder is not  apparent. There is a small round focus of radiotracer accumulation in the region of the porta hepatis which may represent a small amount of radiotracer in the gallbladder neck. IMPRESSION: 1. Nonvisualization of the majority of the gallbladder. There is a small round region of radiotracer uptake in the region of the porta hepatis which may represent the gallbladder neck following correlation with prior CT imaging. There is a large stone in this region which may be obstructing the remainder of the gallbladder lumen. Findings are suggestive of acute cholecystitis in the appropriate clinical setting. 2. Patent common bile duct. Electronically Signed   By: Malachy Moan M.D.   On: 05/01/2017 18:39   US Abdomen Limited Ruq  Result Date: 05/01/2017 CLINICAL DATA:  Right upper quadrant pain with vomiting EXAM: ULTRASOUND ABDOMEN LIMITED RIGHT UPPER QUADRANT COMPARISON:  Abdominal CT 04/12/2016. Right upper quadrant ultrasound 04/12/2016 FINDINGS: Gallbladder: Multiple gallstones including 1.8 cm lodged in the neck. Gallbladder wall thickness measures 3-4 mm without striations. No pericholecystic edema. Patient is medicated, limiting sonographic Murphy sign. Common bile duct: Diameter: 4 mm Liver: Increased liver echogenicity, probable mild steatosis. Portal vein is patent on color Doppler imaging with normal direction of blood flow towards the liver. IMPRESSION: 1. Multiple gallstones including stone fixed in the gallbladder neck. Limited Murphy sign due to analgesic administration, no pericholecystic inflammation to implicate acute cholecystitis. 2. Probable liver steatosis. Electronically Signed   By: Marnee Spring M.D.   On: 05/01/2017 07:18    Anti-infectives: Anti-infectives (From admission, onward)   Start     Dose/Rate Route Frequency Ordered Stop   05/02/17 0745  piperacillin-tazobactam (ZOSYN) IVPB 3.375 g     3.375 g 12.5 mL/hr over 240 Minutes Intravenous Every 8 hours 05/02/17 0744      05/01/17 0730  piperacillin-tazobactam (ZOSYN) IVPB 3.375 g     3.375 g 100 mL/hr over 30 Minutes Intravenous  Once 05/01/17 0721 05/01/17 0835      Assessment/Plan:  Acute cholecystitis Continue heparin and schedule lap chole for tomorrow The risks, benefits, complications, treatment options, and expected outcomes were discussed with the patient. The possibilities of bleeding, recurrent infection, finding a normal gallbladder, perforation of viscus organs, damage to surrounding structures, bile leak, abscess formation, needing a drain placed, the need for additional procedures, reaction to medication, pulmonary aspiration,  failure to diagnose a condition, the possible need to convert to an open procedure, and creating a complication requiring transfusion or operation were discussed with the patient. The patient and/or family concurred with the proposed plan, giving informed consent.  Continue zosyn Hold heparin 6 hrs before surgery  Sterling Big, MD, [facility]  05/02/2017

## 2017-05-03 ENCOUNTER — Inpatient Hospital Stay: Payer: Self-pay | Admitting: Anesthesiology

## 2017-05-03 ENCOUNTER — Encounter
Admission: EM | Disposition: A | Discharge status: Home / Self Care | Payer: Self-pay | Source: Home / Self Care | Attending: Surgery

## 2017-05-03 ENCOUNTER — Ambulatory Visit: Admission: RE | Admit: 2017-05-03 | Payer: Self-pay | Source: Ambulatory Visit | Admitting: Surgery

## 2017-05-03 DIAGNOSIS — K8000 Calculus of gallbladder with acute cholecystitis without obstruction: Secondary | ICD-10-CM

## 2017-05-03 HISTORY — PX: CHOLECYSTECTOMY: SHX55

## 2017-05-03 LAB — CBC
HCT: 38.3 % — ABNORMAL LOW (ref 40.0–52.0)
Hemoglobin: 12.7 g/dL — ABNORMAL LOW (ref 13.0–18.0)
MCH: 28.6 pg (ref 26.0–34.0)
MCHC: 33.1 g/dL (ref 32.0–36.0)
MCV: 86.3 fL (ref 80.0–100.0)
PLATELETS: 168 10*3/uL (ref 150–440)
RBC: 4.44 MIL/uL (ref 4.40–5.90)
RDW: 13.6 % (ref 11.5–14.5)
WBC: 7.2 10*3/uL (ref 3.8–10.6)

## 2017-05-03 LAB — COMPREHENSIVE METABOLIC PANEL
ALT: 53 U/L (ref 17–63)
ANION GAP: 7 (ref 5–15)
AST: 26 U/L (ref 15–41)
Albumin: 2.9 g/dL — ABNORMAL LOW (ref 3.5–5.0)
Alkaline Phosphatase: 79 U/L (ref 38–126)
BUN: 15 mg/dL (ref 6–20)
CHLORIDE: 104 mmol/L (ref 101–111)
CO2: 27 mmol/L (ref 22–32)
Calcium: 8.3 mg/dL — ABNORMAL LOW (ref 8.9–10.3)
Creatinine, Ser: 0.96 mg/dL (ref 0.61–1.24)
GFR calc non Af Amer: >60 mL/min (ref >60)
Glucose, Bld: 93 mg/dL (ref 65–99)
POTASSIUM: 4.4 mmol/L (ref 3.5–5.1)
Sodium: 138 mmol/L (ref 135–145)
Total Bilirubin: 1 mg/dL (ref 0.3–1.2)
Total Protein: 5.4 g/dL — ABNORMAL LOW (ref 6.5–8.1)

## 2017-05-03 LAB — HEPARIN LEVEL (UNFRACTIONATED): Heparin Unfractionated: 0.17 IU/mL — ABNORMAL LOW (ref 0.30–0.70)

## 2017-05-03 SURGERY — LAPAROSCOPIC CHOLECYSTECTOMY
Anesthesia: General | Site: Abdomen | Wound class: Clean Contaminated

## 2017-05-03 MED ORDER — HYDROMORPHONE HCL 1 MG/ML IJ SOLN
INTRAMUSCULAR | Status: AC
  Administered 2017-05-03: 0.5 mg via INTRAVENOUS
  Filled 2017-05-03: qty 1

## 2017-05-03 MED ORDER — SODIUM CHLORIDE 0.9 % IJ SOLN
INTRAMUSCULAR | Status: AC
  Filled 2017-05-03: qty 50

## 2017-05-03 MED ORDER — ONDANSETRON HCL 4 MG/2ML IJ SOLN: 4.0000 mg | Freq: Once | INTRAMUSCULAR | Status: DC | PRN

## 2017-05-03 MED ORDER — BUPIVACAINE LIPOSOME 1.3 % IJ SUSP
INTRAMUSCULAR | Status: AC
  Filled 2017-05-03: qty 20

## 2017-05-03 MED ORDER — OXYCODONE HCL 5 MG PO TABS
10.0000 mg | ORAL_TABLET | ORAL | Status: DC | PRN
  Administered 2017-05-04 (×4): 10 mg via ORAL
  Filled 2017-05-03 (×4): qty 2

## 2017-05-03 MED ORDER — BUPIVACAINE HCL (PF) 0.25 % IJ SOLN
INTRAMUSCULAR | Status: AC
  Filled 2017-05-03: qty 30

## 2017-05-03 MED ORDER — PROPOFOL 10 MG/ML IV BOLUS
INTRAVENOUS | Status: DC | PRN
  Administered 2017-05-03: 200 mg via INTRAVENOUS

## 2017-05-03 MED ORDER — SUGAMMADEX SODIUM 200 MG/2ML IV SOLN
INTRAVENOUS | Status: DC | PRN
  Administered 2017-05-03: 200 mg via INTRAVENOUS

## 2017-05-03 MED ORDER — LACTATED RINGERS IV BOLUS (SEPSIS)
1000.0000 mL | Freq: Once | INTRAVENOUS | Status: AC
Start: 2017-05-03 — End: 2017-05-03
  Administered 2017-05-03: 1000 mL via INTRAVENOUS

## 2017-05-03 MED ORDER — EPHEDRINE SULFATE 50 MG/ML IJ SOLN
INTRAMUSCULAR | Status: DC | PRN
  Administered 2017-05-03: 10 mg via INTRAVENOUS

## 2017-05-03 MED ORDER — BUPIVACAINE-EPINEPHRINE (PF) 0.5% -1:200000 IJ SOLN
INTRAMUSCULAR | Status: AC
  Filled 2017-05-03: qty 30

## 2017-05-03 MED ORDER — FENTANYL CITRATE (PF) 100 MCG/2ML IJ SOLN
25.0000 ug | INTRAMUSCULAR | Status: AC | PRN
  Administered 2017-05-03 (×6): 25 ug via INTRAVENOUS

## 2017-05-03 MED ORDER — BUPIVACAINE LIPOSOME 1.3 % IJ SUSP
INTRAMUSCULAR | Status: DC | PRN
  Administered 2017-05-03: 20 mL

## 2017-05-03 MED ORDER — MIDAZOLAM HCL 5 MG/5ML IJ SOLN
INTRAMUSCULAR | Status: DC | PRN
  Administered 2017-05-03: 2 mg via INTRAVENOUS

## 2017-05-03 MED ORDER — FENTANYL CITRATE (PF) 100 MCG/2ML IJ SOLN
INTRAMUSCULAR | Status: DC | PRN
  Administered 2017-05-03: 50 ug via INTRAVENOUS

## 2017-05-03 MED ORDER — FENTANYL CITRATE (PF) 100 MCG/2ML IJ SOLN
INTRAMUSCULAR | Status: AC
  Administered 2017-05-03: 25 ug via INTRAVENOUS
  Filled 2017-05-03: qty 2

## 2017-05-03 MED ORDER — ACETAMINOPHEN 500 MG PO TABS
1000.0000 mg | ORAL_TABLET | Freq: Four times a day (QID) | ORAL | Status: DC
  Administered 2017-05-03 – 2017-05-04 (×3): 1000 mg via ORAL
  Filled 2017-05-03 (×4): qty 2

## 2017-05-03 MED ORDER — SUCCINYLCHOLINE CHLORIDE 20 MG/ML IJ SOLN
INTRAMUSCULAR | Status: DC | PRN
  Administered 2017-05-03: 100 mg via INTRAVENOUS

## 2017-05-03 MED ORDER — LACTATED RINGERS IV SOLN
INTRAVENOUS | Status: DC | PRN
  Administered 2017-05-03: 18:00:00 via INTRAVENOUS

## 2017-05-03 MED ORDER — LIDOCAINE HCL (CARDIAC) 20 MG/ML IV SOLN
INTRAVENOUS | Status: DC | PRN
  Administered 2017-05-03: 60 mg via INTRAVENOUS

## 2017-05-03 MED ORDER — HYDROMORPHONE HCL 1 MG/ML IJ SOLN
0.5000 mg | INTRAMUSCULAR | Status: AC | PRN
  Administered 2017-05-03 (×4): 0.5 mg via INTRAVENOUS

## 2017-05-03 MED ORDER — DEXAMETHASONE SODIUM PHOSPHATE 10 MG/ML IJ SOLN
INTRAMUSCULAR | Status: DC | PRN
  Administered 2017-05-03: 5 mg via INTRAVENOUS

## 2017-05-03 MED ORDER — ROCURONIUM BROMIDE 100 MG/10ML IV SOLN
INTRAVENOUS | Status: DC | PRN
  Administered 2017-05-03: 5 mg via INTRAVENOUS
  Administered 2017-05-03: 15 mg via INTRAVENOUS
  Administered 2017-05-03: 30 mg via INTRAVENOUS

## 2017-05-03 MED ORDER — BUPIVACAINE HCL 0.25 % IJ SOLN
INTRAMUSCULAR | Status: DC | PRN
  Administered 2017-05-03: 30 mL

## 2017-05-03 MED ORDER — FENTANYL CITRATE (PF) 100 MCG/2ML IJ SOLN: 25.0000 ug | INTRAMUSCULAR | Status: DC | PRN

## 2017-05-03 SURGICAL SUPPLY — 60 items
APPLICATOR COTTON TIP 6IN STRL (MISCELLANEOUS) ×3 IMPLANT
APPLIER CLIP 5 13 M/L LIGAMAX5 (MISCELLANEOUS) ×3
BLADE SURG 15 STRL LF DISP TIS (BLADE) ×1 IMPLANT
BLADE SURG 15 STRL SS (BLADE) ×2
BULB RESERV EVAC DRAIN JP 100C (MISCELLANEOUS) ×3 IMPLANT
CANISTER SUCT 1200ML W/VALVE (MISCELLANEOUS) ×3 IMPLANT
CHLORAPREP W/TINT 26ML (MISCELLANEOUS) ×3 IMPLANT
CHOLANGIOGRAM CATH TAUT (CATHETERS) IMPLANT
CLEANER CAUTERY TIP 5X5 PAD (MISCELLANEOUS) ×1 IMPLANT
CLIP APPLIE 5 13 M/L LIGAMAX5 (MISCELLANEOUS) ×1 IMPLANT
DECANTER SPIKE VIAL GLASS SM (MISCELLANEOUS) ×3 IMPLANT
DERMABOND ADVANCED (GAUZE/BANDAGES/DRESSINGS) ×2
DERMABOND ADVANCED .7 DNX12 (GAUZE/BANDAGES/DRESSINGS) ×1 IMPLANT
DRAIN CHANNEL JP 19F (MISCELLANEOUS) ×3 IMPLANT
DRAPE C-ARM XRAY 36X54 (DRAPES) IMPLANT
DRAPE INCISE IOBAN 66X45 STRL (DRAPES) ×3 IMPLANT
DRSG OPSITE POSTOP 4X6 (GAUZE/BANDAGES/DRESSINGS) ×3 IMPLANT
DRSG TEGADERM 2X2.25 PEDS (GAUZE/BANDAGES/DRESSINGS) ×6 IMPLANT
ELECT CAUTERY BLADE 6.4 (BLADE) ×3 IMPLANT
ELECT REM PT RETURN 9FT ADLT (ELECTROSURGICAL) ×3
ELECTRODE REM PT RTRN 9FT ADLT (ELECTROSURGICAL) ×1 IMPLANT
GELPORT LAPAROSCOPIC (MISCELLANEOUS) ×3 IMPLANT
GLOVE BIO SURGEON STRL SZ7 (GLOVE) ×12 IMPLANT
GOWN STRL REUS W/ TWL LRG LVL3 (GOWN DISPOSABLE) ×3 IMPLANT
GOWN STRL REUS W/TWL LRG LVL3 (GOWN DISPOSABLE) ×6
IRRIGATION STRYKERFLOW (MISCELLANEOUS) ×1 IMPLANT
IRRIGATOR STRYKERFLOW (MISCELLANEOUS) ×3
IV CATH ANGIO 12GX3 LT BLUE (NEEDLE) IMPLANT
IV NS 1000ML (IV SOLUTION) ×2
IV NS 1000ML BAXH (IV SOLUTION) ×1 IMPLANT
L-HOOK LAP DISP 36CM (ELECTROSURGICAL) ×3
LHOOK LAP DISP 36CM (ELECTROSURGICAL) ×1 IMPLANT
NEEDLE HYPO 22GX1.5 SAFETY (NEEDLE) ×3 IMPLANT
PACK LAP CHOLECYSTECTOMY (MISCELLANEOUS) ×3 IMPLANT
PAD CLEANER CAUTERY TIP 5X5 (MISCELLANEOUS) ×2
PENCIL ELECTRO HAND CTR (MISCELLANEOUS) ×3 IMPLANT
POUCH SPECIMEN RETRIEVAL 10MM (ENDOMECHANICALS) ×3 IMPLANT
SCISSORS METZENBAUM CVD 33 (INSTRUMENTS) ×3 IMPLANT
SLEEVE ENDOPATH XCEL 5M (ENDOMECHANICALS) ×6 IMPLANT
SOL ANTI-FOG 6CC FOG-OUT (MISCELLANEOUS) ×1 IMPLANT
SOL FOG-OUT ANTI-FOG 6CC (MISCELLANEOUS) ×2
SPONGE DRAIN TRACH 4X4 STRL 2S (GAUZE/BANDAGES/DRESSINGS) ×3 IMPLANT
SPONGE GAUZE 2X2 8PLY STER LF (GAUZE/BANDAGES/DRESSINGS) ×2
SPONGE GAUZE 2X2 8PLY STRL LF (GAUZE/BANDAGES/DRESSINGS) ×4 IMPLANT
SPONGE LAP 18X18 5 PK (GAUZE/BANDAGES/DRESSINGS) ×6 IMPLANT
STAPLER SKIN PROX 35W (STAPLE) ×3 IMPLANT
STOPCOCK 4 WAY LG BORE MALE ST (IV SETS) IMPLANT
SUT ETHIBOND 0 MO6 C/R (SUTURE) IMPLANT
SUT ETHILON 3-0 FS-10 30 BLK (SUTURE) ×3
SUT MNCRL AB 4-0 PS2 18 (SUTURE) ×3 IMPLANT
SUT PDS AB 0 CT1 27 (SUTURE) ×6 IMPLANT
SUT VIC AB 4-0 RB1 18 (SUTURE) IMPLANT
SUT VICRYL 0 AB UR-6 (SUTURE) ×6 IMPLANT
SUTURE EHLN 3-0 FS-10 30 BLK (SUTURE) ×1 IMPLANT
SYR 20CC LL (SYRINGE) ×3 IMPLANT
SYR BULB IRRIG 60ML STRL (SYRINGE) ×3 IMPLANT
TROCAR XCEL BLUNT TIP 100MML (ENDOMECHANICALS) ×3 IMPLANT
TROCAR XCEL NON-BLD 5MMX100MML (ENDOMECHANICALS) ×3 IMPLANT
TUBING INSUFFLATION (TUBING) ×3 IMPLANT
WATER STERILE IRR 1000ML POUR (IV SOLUTION) IMPLANT

## 2017-05-03 NOTE — Consult Note (Signed)
ANTICOAGULATION CONSULT NOTE - Initial Consult  Pharmacy Consult for Heparin Drip  Indication: pulmonary embolus and VTE prophylaxis  Allergies  Allergen Reactions  . Vicodin [Hydrocodone-Acetaminophen] Other (See Comments)    Nose bleed    Patient Measurements: Height: 5\' 8"  (172.7 cm) Weight: (!) 317 lb 9.6 oz (144.1 kg) IBW/kg (Calculated) : 68.4 Heparin Dosing Weight: 102 kg  Vital Signs: Temp: 98.1 F (36.7 C) (03/08 0355) Temp Source: Oral (03/08 0355) BP: 99/48 (03/08 0355) Pulse Rate: 65 (03/08 0355)  Labs: Recent Labs    05/01/17 0537 05/01/17 1128  05/01/17 1301 05/01/17 1925 05/02/17 0138 05/03/17 0522  HGB 14.7  --   --   --   --  14.1 12.7*  HCT 45.1  --   --   --   --  42.7 38.3*  PLT 209  --   --   --   --  204 168  APTT  --   --   --  28 50*  --   --   LABPROT  --   --   --  13.4  --   --   --   INR  --   --   --  1.03  --   --   --   HEPARINUNFRC  --   --    < > 0.32 0.60 0.54 0.17*  CREATININE 0.95  --   --   --   --  0.90  --   TROPONINI <0.03 <0.03  --   --   --   --   --    < > = values in this interval not displayed.    Estimated Creatinine Clearance: 149.3 mL/min (by C-G formula based on SCr of 0.9 mg/dL).   Medical History: Past Medical History:  Diagnosis Date  . Chronic pain   . Depression   . GERD (gastroesophageal reflux disease)   . Hypertension   . Morbid obesity (HCC)   . OSA (obstructive sleep apnea)    sleep study done 05/2012  . PE (pulmonary embolism) Nov 2013   bilateral following R leg injury  . 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 Peripheral neuropathy   . Personal history of pulmonary embolism 12/28/2011   bilateral following R leg injury     Assessment: Pharmacy consulted for heparin drip dosing and monitoring in 42 yo male with PMH of PE. Patient was taking Xarelto PTA, last dose 3/5 at 0700. Xarelto is being held while inpatient for possible surgery.   Goal of Therapy:  Heparin  level 0.3-0.7 units/ml aPTT 66-102 seconds Monitor platelets by anticoagulation protocol: Yes   Plan:  Heparin dosing weight 102kg  3/6 1925 HL therapeutic at 0.60. Will continue current infusion rate of 1550 units/hr. Will recheck HL in 6 hours.  Continue to monitor H&H and platelets  03/07 0130 heparin level 0.54. Continue current regimen. Recheck heparin level and CBC with tomorrow AM labs.  03/08 AM heparin level 0.17. Heparin off since 0400 for surgery.  Erich MontaneMcBane,Keira Bohlin S, PharmD, BCPS Clinical Pharmacist 05/03/2017 5:59 AM

## 2017-05-03 NOTE — Anesthesia Post-op Follow-up Note (Signed)
Anesthesia QCDR form completed.        

## 2017-05-03 NOTE — Op Note (Signed)
Laparoscopic Hand  Assisted Cholecystectomy  Pre-operative Diagnosis: Acute cholecystitis  Post-operative Diagnosis: same  Procedure:  Hand assisted lap chole  Surgeon: Sterling Bigiego Florence Yeung, MD FACS  Anesthesia: Gen. with endotracheal tube  Findings: Acute Cholecystitis   Estimated Blood Loss: 50cc         Drains: 19 blake drain RUQ         Specimens: Gallbladder           Complications: none   Procedure Details  The patient was seen again in the Holding Room. The benefits, complications, treatment options, and expected outcomes were discussed with the patient. The risks of bleeding, infection, recurrence of symptoms, failure to resolve symptoms, bile duct damage, bile duct leak, retained common bile duct stone, bowel injury, any of which could require further surgery and/or ERCP, stent, or papillotomy were reviewed with the patient. The likelihood of improving the patient's symptoms with return to their baseline status is good.  The patient and/or family concurred with the proposed plan, giving informed consent.  The patient was taken to Operating Room, identified as Davonta A Avedisian and the procedure verified as Laparoscopic Cholecystectomy.  A Time Out was held and the above information confirmed.  Prior to the induction of general anesthesia, antibiotic prophylaxis was administered. VTE prophylaxis was in place. General endotracheal anesthesia was then administered and tolerated well. After the induction, the abdomen was prepped with Chloraprep and draped in the sterile fashion. The patient was positioned in the supine position.  Cut down technique was used to enter the abdominal cavity and a Hasson trochar was placed after two vicryl stitches were anchored to the fascia. Pneumoperitoneum was then created with CO2 and tolerated well without any adverse changes in the patient's vital signs.  Three 5-mm ports were placed in the right upper quadrant all under direct vision. All skin incisions   were infiltrated with a local anesthetic agent before making the incision and placing the trocars.   The patient was positioned  in reverse Trendelenburg, tilted slightly to the patient's left.  The gallbladder was identified, the fundus grasped and retracted cephalad. Adhesions were lysed bluntly.  Severe adhesions and difficult exposure.  I decided to place a gel port to assit w exposure.  Periumbilical incision extended to be able to place gelport.  The infundibulum was grasped and retracted laterally, exposing the peritoneum overlying the triangle of Calot. This was then divided and exposed in a blunt fashion. An extended critical view of the cystic duct and cystic artery was obtained.  The cystic duct was clearly identified and bluntly dissected.   Artery and duct were double clipped and divided.  The gallbladder was taken from the gallbladder fossa in a retrograde fashion with the electrocautery. The gallbladder was removed and placed in an Endocatch bag. The liver bed was irrigated and inspected. Hemostasis was achieved with the electrocautery. Copious irrigation was utilized and was repeatedly aspirated until clear.  The gallbladder and Endocatch sac were then removed through a port site.   Inspection of the right upper quadrant was performed. No bleeding, bile duct injury or leak, or bowel injury was noted. Pneumoperitoneum was released.    Fascia close w 0 PDS suture in a running fashion Exparel used for post op analgesia   Staples used to close the skin.  The patient was then extubated and brought to the recovery room in stable condition. Sponge, lap, and needle counts were correct at closure and at the conclusion of the case.  Caroleen Hamman, MD, FACS

## 2017-05-03 NOTE — Progress Notes (Signed)
Preoperative Review   Patient is met in the preoperatively. The history is reviewed in the chart and with the patient. I personally reviewed the options and rationale as well as the risks of this procedure that have been previously discussed with the patient. All questions asked by the patient and/or family were answered to their satisfaction.  Patient agrees to proceed with this procedure at this time.  Diego Pabon M.D. FACS   

## 2017-05-03 NOTE — Anesthesia Preprocedure Evaluation (Signed)
Anesthesia Evaluation  Patient identified by MRN, date of birth, ID band Patient awake    Reviewed: Allergy & Precautions, H&P , NPO status , Patient's Chart, lab work & pertinent test results, reviewed documented beta blocker date and time   Airway Mallampati: III  TM Distance: >3 FB Neck ROM: full    Dental  (+) Teeth Intact   Pulmonary neg pulmonary ROS, sleep apnea and Continuous Positive Airway Pressure Ventilation , former smoker,    Pulmonary exam normal        Cardiovascular Exercise Tolerance: Good hypertension, On Medications negative cardio ROS Normal cardiovascular exam Rhythm:regular Rate:Normal     Neuro/Psych PSYCHIATRIC DISORDERS Depression  Neuromuscular disease negative neurological ROS  negative psych ROS   GI/Hepatic negative GI ROS, Neg liver ROS, GERD  Medicated,  Endo/Other  negative endocrine ROSMorbid obesity  Renal/GU negative Renal ROS  negative genitourinary   Musculoskeletal   Abdominal   Peds  Hematology negative hematology ROS (+)   Anesthesia Other Findings Past Medical History: No date: Chronic pain No date: Depression No date: GERD (gastroesophageal reflux disease) No date: Hypertension No date: Morbid obesity (HCC) No date: OSA (obstructive sleep apnea)     Comment:  sleep study done 05/2012 Nov 2013: PE (pulmonary embolism)     Comment:  bilateral following R leg injury Sept 2014: PE (pulmonary embolism)     Comment:  recurrent bilateral PE's No date: Peripheral neuropathy 12/28/2011: Personal history of pulmonary embolism     Comment:  bilateral following R leg injury  Past Surgical History: 11/2012: CARDIOVASCULAR STRESS TEST     Comment:  normal 04/12/2016: ESOPHAGOGASTRODUODENOSCOPY (EGD) WITH PROPOFOL; N/A     Comment:  Procedure: ESOPHAGOGASTRODUODENOSCOPY (EGD) WITH               PROPOFOL;  Surgeon: Midge Miniumarren Wohl, MD;  Location: [facility]               ENDOSCOPY;   Service: Endoscopy;  Laterality: N/A; No date: HERNIA REPAIR BMI    Body Mass Index:  48.29 kg/m     Reproductive/Obstetrics negative OB ROS                             Anesthesia Physical Anesthesia Plan  ASA: III and emergent  Anesthesia Plan: General ETT   Post-op Pain Management:    Induction:   PONV Risk Score and Plan: 3  Airway Management Planned:   Additional Equipment:   Intra-op Plan:   Post-operative Plan:   Informed Consent: I have reviewed the patients History and Physical, chart, labs and discussed the procedure including the risks, benefits and alternatives for the proposed anesthesia with the patient or authorized representative who has indicated his/her understanding and acceptance.   Dental Advisory Given  Plan Discussed with: CRNA  Anesthesia Plan Comments:         Anesthesia Quick Evaluation

## 2017-05-03 NOTE — Anesthesia Procedure Notes (Signed)
Procedure Name: Intubation Date/Time: 05/03/2017 5:37 PM Performed by: Lily KocherPeralta, Tani Virgo, CRNA Pre-anesthesia Checklist: Patient identified, Patient being monitored, Timeout performed, Emergency Drugs available and Suction available Patient Re-evaluated:Patient Re-evaluated prior to induction Oxygen Delivery Method: Circle system utilized Preoxygenation: Pre-oxygenation with 100% oxygen Induction Type: IV induction Ventilation: Mask ventilation without difficulty Laryngoscope Size: McGraph and 4 Grade View: Grade I Tube type: Oral Tube size: 7.5 mm Number of attempts: 1 Airway Equipment and Method: Stylet and Video-laryngoscopy Placement Confirmation: ETT inserted through vocal cords under direct vision,  positive ETCO2 and breath sounds checked- equal and bilateral Secured at: 23 cm Tube secured with: Tape Dental Injury: Teeth and Oropharynx as per pre-operative assessment

## 2017-05-03 NOTE — Transfer of Care (Signed)
Immediate Anesthesia Transfer of Care Note  Patient: Jack Sullivan  Procedure(s) Performed: LAPAROSCOPIC CHOLECYSTECTOMY (N/A Abdomen)  Patient Location: PACU  Anesthesia Type:General  Level of Consciousness: awake  Airway & Oxygen Therapy: Patient Spontanous Breathing and Patient connected to face mask oxygen  Post-op Assessment: Report given to RN  Post vital signs: Reviewed and stable  Last Vitals:  Vitals:   05/03/17 1626 05/03/17 1920  BP: 95/63 (!) 109/45  Pulse: 67 84  Resp:  (!) 30  Temp:  37 C  SpO2:  98%    Last Pain:  Vitals:   05/03/17 1417  TempSrc:   PainSc: 3       Patients Stated Pain Goal: 2 (05/03/17 1300)  Complications: No apparent anesthesia complications

## 2017-05-03 NOTE — Anesthesia Postprocedure Evaluation (Signed)
Anesthesia Post Note  Patient: Deanna A Cress  Procedure(s) Performed: LAPAROSCOPIC CHOLECYSTECTOMY (N/A Abdomen)  Patient location during evaluation: PACU Anesthesia Type: General Level of consciousness: awake and alert Pain management: pain level controlled Vital Signs Assessment: post-procedure vital signs reviewed and stable Respiratory status: spontaneous breathing, nonlabored ventilation, respiratory function stable and patient connected to nasal cannula oxygen Cardiovascular status: blood pressure returned to baseline and stable Postop Assessment: no apparent nausea or vomiting Anesthetic complications: no     Last Vitals:  Vitals:   05/03/17 2010 05/03/17 2015  BP: (!) 101/56   Pulse: 85 81  Resp: 16 16  Temp:    SpO2: 96% 96%    Last Pain:  Vitals:   05/03/17 2010  TempSrc:   PainSc: 4                  Yevette EdwardsJames G Toniette Devera

## 2017-05-04 ENCOUNTER — Encounter: Payer: Self-pay | Admitting: Surgery

## 2017-05-04 LAB — CBC
HCT: 39 % — ABNORMAL LOW (ref 40.0–52.0)
Hemoglobin: 12.9 g/dL — ABNORMAL LOW (ref 13.0–18.0)
MCH: 28.6 pg (ref 26.0–34.0)
MCHC: 33.2 g/dL (ref 32.0–36.0)
MCV: 86.3 fL (ref 80.0–100.0)
PLATELETS: 203 10*3/uL (ref 150–440)
RBC: 4.51 MIL/uL (ref 4.40–5.90)
RDW: 13.8 % (ref 11.5–14.5)
WBC: 11.8 10*3/uL — AB (ref 3.8–10.6)

## 2017-05-04 LAB — COMPREHENSIVE METABOLIC PANEL
ALBUMIN: 3.3 g/dL — AB (ref 3.5–5.0)
ALT: 64 U/L — ABNORMAL HIGH (ref 17–63)
AST: 48 U/L — AB (ref 15–41)
Alkaline Phosphatase: 83 U/L (ref 38–126)
Anion gap: 9 (ref 5–15)
BUN: 15 mg/dL (ref 6–20)
CHLORIDE: 107 mmol/L (ref 101–111)
CO2: 23 mmol/L (ref 22–32)
Calcium: 8.5 mg/dL — ABNORMAL LOW (ref 8.9–10.3)
Creatinine, Ser: 0.83 mg/dL (ref 0.61–1.24)
GFR calc Af Amer: >60 mL/min (ref >60)
Glucose, Bld: 116 mg/dL — ABNORMAL HIGH (ref 65–99)
POTASSIUM: 4.4 mmol/L (ref 3.5–5.1)
Sodium: 139 mmol/L (ref 135–145)
Total Bilirubin: 0.8 mg/dL (ref 0.3–1.2)
Total Protein: 6.7 g/dL (ref 6.5–8.1)

## 2017-05-04 MED ORDER — OXYCODONE HCL 5 MG PO TABS: 5.0000 mg | ORAL_TABLET | ORAL | 0 refills | Status: DC | PRN

## 2017-05-04 NOTE — Discharge Instructions (Signed)
Surgical Drain Home Care °Surgical drains are used to remove extra fluid that normally builds up in a surgical wound after surgery. A surgical drain helps to heal a surgical wound. Different kinds of surgical drains include: °· Active drains. These drains use suction to pull drainage away from the surgical wound. Drainage flows through a tube to a container outside of the body. It is important to keep the bulb or the drainage container flat (compressed) at all times, except while you empty it. Flattening the bulb or container creates suction. The two most common types of active drains are bulb drains and Hemovac drains. °· Passive drains. These drains allow fluid to drain naturally, by gravity. Drainage flows through a tube to a bandage (dressing) or a container outside of the body. Passive drains do not need to be emptied. The most common type of passive drain is the Penrose drain. ° °A drain is placed during surgery. Immediately after surgery, drainage is usually bright red and a little thicker than water. The drainage may gradually turn yellow or pink and become thinner. It is likely that your health care provider will remove the drain when the drainage stops or when the amount decreases to 1-2 Tbsp (15-30 mL) during a 24-hour period. °How to care for your surgical drain °· Keep the skin around the drain dry and covered with a dressing at all times. °· Check your drain area every day for signs of infection. Check for: °? More redness, swelling, or pain. °? Pus or a bad smell. °? Cloudy drainage. °Follow instructions from your health care provider about how to take care of your drain and how to change your dressing. Change your dressing at least one time every day. Change it more often if needed to keep the dressing dry. Make sure you: °1. Gather your supplies, including: °? Tape. °? Germ-free cleaning solution (sterile saline). °? Split gauze drain sponge: 4 x 4 inches (10 x 10 cm). °? Gauze square: 4 x 4 inches  (10 x 10 cm). °2. Wash your hands with soap and water before you change your dressing. If soap and water are not available, use hand sanitizer. °3. Remove the old dressing. Avoid using scissors to do that. °4. Use sterile saline to clean your skin around the drain. °5. Place the tube through the slit in a drain sponge. Place the drain sponge so that it covers your wound. °6. Place the gauze square or another drain sponge on top of the drain sponge that is on the wound. Make sure the tube is between those layers. °7. Tape the dressing to your skin. °8. If you have an active bulb or Hemovac drain, tape the drainage tube to your skin 1-2 inches (2.5-5 cm) below the place where the tube enters your body. Taping keeps the tube from pulling on any stitches (sutures) that you have. °9. Wash your hands with soap and water. °10. Write down the color of your drainage and how often you change your dressing. ° °How to empty your active bulb or Hemovac drain °1. Make sure that you have a measuring cup that you can empty your drainage into. °2. Wash your hands with soap and water. If soap and water are not available, use hand sanitizer. °3. Gently move your fingers down the tube while squeezing very lightly. This is called stripping the tube. This clears any drainage, clots, or tissue from the tube. °? Do not pull on the tube. °? You may need to strip   the tube several times every day to keep the tube clear. 4. Open the bulb cap or the drain plug. Do not touch the inside of the cap or the bottom of the plug. 5. Empty all of the drainage into the measuring cup. 6. Compress the bulb or the container and replace the cap or the plug. To compress the bulb or the container, squeeze it firmly in the middle while you close the cap or plug the container. 7. Write down the amount of drainage that you have in each 24-hour period. If you have less than 2 Tbsp (30 mL) of drainage during 24 hours, contact your health care  provider. 8. Flush the drainage down the toilet. 9. Wash your hands with soap and water. Contact a health care provider if:  You have more redness, swelling, or pain around your drain area.  The amount of drainage that you have is increasing instead of decreasing.  You have pus or a bad smell coming from your drain area.  You have a fever.  You have drainage that is cloudy.  There is a sudden stop or a sudden decrease in the amount of drainage that you have.  Your tube falls out.  Your active draindoes not stay compressedafter you empty it. This information is not intended to replace advice given to you by your health care provider. Make sure you discuss any questions you have with your health care provider. Document Released: 02/10/2000 Document Revised: 07/21/2015 Document Reviewed: 09/01/2014 Elsevier Interactive Patient Education  2018 [employer] PATIENT INSTRUCTIONS GALL BLADDER SURGERY (CHOLECYSTECTOMY)  FOLLOW-UP: Call your physician immediately if you have any fevers greater than 102.5, drainage from your wound that is not clear or looks infected, persistent bleeding, increasing abdominal pain, problems urinating, or persistent nausea/vomiting.  You should be aware that you may have right shoulder pain after surgery and that this will progressively go away.  This is called 'referred pain' and is from the area of the gallbladder.  It can also be caused by gas that may be trapped under the diaphragm from the surgery, especially if it was performed laparoscopically through mini-incisions.  This gas will progressively get reabsorbed by your body.   WOUND CARE INSTRUCTIONS:  Keep a dry clean dressing on the wound if there is drainage. The initial bandage may be removed after 24 hours.  Once the wound has quit draining you may leave it open to air.  If clothing rubs against the wound or causes irritation and the wound is not draining you may cover it with a dry dressing during  the daytime.  Try to keep the wound dry and avoid ointments on the wound unless directed to do so.  If the wound becomes bright red and painful or starts to drain infected material that is not clear, please contact your physician immediately.   You should also call if you begin to drain fluid that is thin and greenish-brown from the wound and appears to look like bile.  If the wound though is mildly pink and has a thick firm ridge underneath it, this is normal, and is referred to as a healing ridge.  This will resolve over the next 4-6 weeks.  DIET:  You may eat any foods that you can tolerate.  It is a good idea to eat a high fiber diet and take in plenty of fluids to prevent constipation.  If you do become constipated you may want to take a mild laxative or take ducolax tablets  on a daily basis until your bowel habits are regular.  Constipation can be very uncomfortable, along with straining, after recent abdominal surgery.  ACTIVITY:  You are encouraged to cough and deep breath or use your incentive spirometer if you were given one, every 15-30 minutes when awake.  This will help prevent respiratory complications and low grade fevers post-operatively.  You may want to hug a pillow when coughing and sneezing to add additional support to the surgical area(s) which will decrease pain during these times.  You are encouraged to walk and engage in light activity for the next two weeks.  You should not lift more than 20 pounds during this time frame as it could put you at increased risk for a post-operative hernia.  Twenty pounds is roughly equivalent to a plastic bag of groceries.    MEDICATIONS:  Try to take narcotic medications and anti-inflammatory medications, such as tylenol, ibuprofen, naprosyn, etc., with food.  This will minimize stomach upset from the medication.  Should you develop nausea and vomiting from the pain medication, or develop a rash, please discontinue the medication and contact your  physician.  You should not drive, make important decisions, or operate machinery when taking narcotic pain medication.  QUESTIONS:  Please feel free to call your physician or the hospital operator if you have any questions, and they will be glad to assist you.  General Anesthesia, Adult, Care After These instructions provide you with information about caring for yourself after your procedure. Your health care provider may also give you more specific instructions. Your treatment has been planned according to current medical practices, but problems sometimes occur. Call your health care provider if you have any problems or questions after your procedure. What can I expect after the procedure? After the procedure, it is common to have:  Vomiting.  A sore throat.  Mental slowness.  It is common to feel:  Nauseous.  Cold or shivery.  Sleepy.  Tired.  Sore or achy, even in parts of your body where you did not have surgery.  Follow these instructions at home: For at least 24 hours after the procedure:  Do not: ? Participate in activities where you could fall or become injured. ? Drive. ? Use heavy machinery. ? Drink alcohol. ? Take sleeping pills or medicines that cause drowsiness. ? Make important decisions or sign legal documents. ? Take care of children on your own.  Rest. Eating and drinking  If you vomit, drink water, juice, or soup when you can drink without vomiting.  Drink enough fluid to keep your urine clear or pale yellow.  Make sure you have little or no nausea before eating solid foods.  Follow the diet recommended by your health care provider. General instructions  Have a responsible adult stay with you until you are awake and alert.  Return to your normal activities as told by your health care provider. Ask your health care provider what activities are safe for you.  Take over-the-counter and prescription medicines only as told by your health care  provider.  If you smoke, do not smoke without supervision.  Keep all follow-up visits as told by your health care provider. This is important. Contact a health care provider if:  You continue to have nausea or vomiting at home, and medicines are not helpful.  You cannot drink fluids or start eating again.  You cannot urinate after 8-12 hours.  You develop a skin rash.  You have fever.  You have increasing  redness at the site of your procedure. Get help right away if:  You have difficulty breathing.  You have chest pain.  You have unexpected bleeding.  You feel that you are having a life-threatening or urgent problem. This information is not intended to replace advice given to you by your health care provider. Make sure you discuss any questions you have with your health care provider. Document Released: 05/21/2000 Document Revised: 07/18/2015 Document Reviewed: 01/27/2015 Elsevier Interactive Patient Education  [employer]2018 [employer]

## 2017-05-04 NOTE — Discharge Summary (Signed)
Patient ID: Jack Sullivan MRN: [id number] DOB/AGE: 1975/04/25 42 y.o.  Admit date: 05/01/2017 Discharge date: 05/04/2017   Discharge Diagnoses:  Active Problems:   Epigastric pain   Umbilical hernia without obstruction and without gangrene   Calculus of gallbladder with acute cholecystitis without obstruction   Procedures: Hand assisted laparoscopic cholecystectomy  Hospital Course: 42 year old morbidly obese male with a history of a previous PE anticoagulated admitted to the hospital initially for chest pain and epigastric pain.  Initially admitted to the hospitalist to rule out any PE or cardiac ischemia.  His workup was negative and he did have a HIDA scan showed evidence of cholecystitis.  His Xarelto was held and he was bridged to heparin drip.  He was probably scheduled for a laparoscopic cholecystectomy that was very challenging due to the patient body habitus and significant inflammation.  We had to use the hand-assisted port to facilitate the dissection.  He stayed overnight and did very well.  At the time of discharge he was ambulating tolerating regular diet.  His pain was controlled and his vital signs were stable.  His physical exam showed a male in no acute distress.  Awake and alert.  Abdomen incisions clean dry and intact, JP with serous fluid.  No peritonitis. Patient at time of discharge was stable. HE will resume his xarelto.  Consults: Hopsitalist  Disposition: 01-Home or Self Care  Discharge Instructions    Call MD for:  difficulty breathing, headache or visual disturbances   Complete by:  As directed    Call MD for:  difficulty breathing, headache or visual disturbances   Complete by:  As directed    Call MD for:  extreme fatigue   Complete by:  As directed    Call MD for:  extreme fatigue   Complete by:  As directed    Call MD for:  hives   Complete by:  As directed    Call MD for:  hives   Complete by:  As directed    Call MD for:  persistant dizziness or  light-headedness   Complete by:  As directed    Call MD for:  persistant dizziness or light-headedness   Complete by:  As directed    Call MD for:  persistant nausea and vomiting   Complete by:  As directed    Call MD for:  persistant nausea and vomiting   Complete by:  As directed    Call MD for:  redness, tenderness, or signs of infection (pain, swelling, redness, odor or green/yellow discharge around incision site)   Complete by:  As directed    Call MD for:  redness, tenderness, or signs of infection (pain, swelling, redness, odor or green/yellow discharge around incision site)   Complete by:  As directed    Call MD for:  severe uncontrolled pain   Complete by:  As directed    Call MD for:  severe uncontrolled pain   Complete by:  As directed    Call MD for:  temperature >100.4   Complete by:  As directed    Call MD for:  temperature >100.4   Complete by:  As directed    Diet - low sodium heart healthy   Complete by:  As directed    Diet - low sodium heart healthy   Complete by:  As directed    Discharge instructions   Complete by:  As directed    Shower Saturday am   Discharge instructions   Complete by:  As directed    Teach pt about JP care, may shower in am   Increase activity slowly   Complete by:  As directed    Increase activity slowly   Complete by:  As directed    Lifting restrictions   Complete by:  As directed    20 lbs x 6 wks   Lifting restrictions   Complete by:  As directed    20 lbs x 6wks   Remove dressing in 24 hours   Complete by:  As directed    Remove dressing in 24 hours   Complete by:  As directed      Allergies as of 05/04/2017      Reactions   Vicodin [hydrocodone-acetaminophen] Other (See Comments)   Nose bleed      Medication List    TAKE these medications   amLODipine 5 MG tablet Commonly known as:  NORVASC Take 1 tablet (5 mg total) by mouth daily.   escitalopram 20 MG tablet Commonly known as:  LEXAPRO Take 20 mg by mouth  daily.   famotidine 40 MG tablet Commonly known as:  PEPCID Take 1 tablet (40 mg total) by mouth every evening.   lisinopril 20 MG tablet Commonly known as:  PRINIVIL,ZESTRIL Take 1 tablet (20 mg total) by mouth daily.   omeprazole 20 MG capsule Commonly known as:  PRILOSEC Take 1 capsule (20 mg total) by mouth 2 (two) times daily before a meal. If needed; long-term use carries risk; talk to your doctor What changed:    how much to take  when to take this  additional instructions   ondansetron 4 MG tablet Commonly known as:  ZOFRAN Take 1 tablet (4 mg total) by mouth every 8 (eight) hours as needed for nausea or vomiting.   oxyCODONE 5 MG immediate release tablet Commonly known as:  ROXICODONE Take 1 tablet (5 mg total) by mouth every 4 (four) hours as needed for severe pain.   traMADol 50 MG tablet Commonly known as:  ULTRAM Take 1 tablet (50 mg total) by mouth every 6 (six) hours as needed.   traZODone 50 MG tablet Commonly known as:  DESYREL Take 1-2 tablets (50-100 mg total) by mouth at bedtime as needed for sleep.   venlafaxine XR 150 MG 24 hr capsule Commonly known as:  EFFEXOR-XR Take 1 capsule (150 mg total) by mouth daily with breakfast. (this replaces Cymbalta / duloxetine)   XARELTO 20 MG Tabs tablet Generic drug:  rivaroxaban TAKE 1 TABLET BY MOUTH ONCE DAILY WITH SUPPER      Follow-up Information    Leafy Ro, MD Follow up on 05/10/2017.   Specialty:  General Surgery Why:  Please call office to make this appointment! Contact information: [address] [place] [address] [phone]            Sterling Big, MD FACS

## 2017-05-04 NOTE — Plan of Care (Signed)
  Progressing Pain Managment: General experience of comfort will improve 05/04/2017 0101 - Progressing by Dorna LeitzNesbitt, Gustie Bobb M, RN Clinical Measurements: Postoperative complications will be avoided or minimized 05/04/2017 0102 - Progressing by Dorna LeitzNesbitt, Runette Scifres M, RN Skin Integrity: Demonstration of wound healing without infection will improve 05/04/2017 0102 - Progressing by Dorna LeitzNesbitt, Arelly Whittenberg M, RN

## 2017-05-04 NOTE — Plan of Care (Signed)
  Progressing Pain Managment: General experience of comfort will improve 05/04/2017 0101 - Progressing by Dorna LeitzNesbitt, Andrius Andrepont M, RN

## 2017-05-07 LAB — SURGICAL PATHOLOGY

## 2017-05-10 ENCOUNTER — Ambulatory Visit (INDEPENDENT_AMBULATORY_CARE_PROVIDER_SITE_OTHER): Payer: Self-pay | Admitting: Surgery

## 2017-05-10 ENCOUNTER — Encounter: Payer: Self-pay | Admitting: Surgery

## 2017-05-10 VITALS — BP 117/77 | HR 74 | Temp 97.4°F | Ht 68.0 in | Wt 313.0 lb

## 2017-05-10 DIAGNOSIS — Z09 Encounter for follow-up examination after completed treatment for conditions other than malignant neoplasm: Secondary | ICD-10-CM

## 2017-05-10 NOTE — Progress Notes (Signed)
S/p HA lap  Chole Doing great No pain Minimal output jp No fevers, + PO Path d/w pt   PE NAD Abd: incision c/d/i jp removed and staples removed. No infection or peritonitis  A/P Doing well No heavy lifting RTC prn

## 2017-05-10 NOTE — Patient Instructions (Signed)

## 2017-05-20 ENCOUNTER — Encounter: Payer: Medicaid Other | Admitting: Surgery

## 2018-03-15 ENCOUNTER — Emergency Department: Payer: Self-pay

## 2018-03-15 ENCOUNTER — Other Ambulatory Visit: Payer: Self-pay

## 2018-03-15 ENCOUNTER — Encounter: Payer: Self-pay | Admitting: Emergency Medicine

## 2018-03-15 ENCOUNTER — Emergency Department
Admission: EM | Admit: 2018-03-15 | Discharge: 2018-03-15 | Disposition: A | Discharge status: Home / Self Care | Payer: Self-pay | Attending: Emergency Medicine | Admitting: Emergency Medicine

## 2018-03-15 DIAGNOSIS — Z86711 Personal history of pulmonary embolism: Secondary | ICD-10-CM | POA: Insufficient documentation

## 2018-03-15 DIAGNOSIS — I10 Essential (primary) hypertension: Secondary | ICD-10-CM | POA: Insufficient documentation

## 2018-03-15 DIAGNOSIS — Z87891 Personal history of nicotine dependence: Secondary | ICD-10-CM | POA: Insufficient documentation

## 2018-03-15 DIAGNOSIS — Y9289 Other specified places as the place of occurrence of the external cause: Secondary | ICD-10-CM | POA: Insufficient documentation

## 2018-03-15 DIAGNOSIS — Y998 Other external cause status: Secondary | ICD-10-CM | POA: Insufficient documentation

## 2018-03-15 DIAGNOSIS — Y9389 Activity, other specified: Secondary | ICD-10-CM | POA: Insufficient documentation

## 2018-03-15 DIAGNOSIS — S20211A Contusion of right front wall of thorax, initial encounter: Secondary | ICD-10-CM | POA: Insufficient documentation

## 2018-03-15 DIAGNOSIS — Z79899 Other long term (current) drug therapy: Secondary | ICD-10-CM | POA: Insufficient documentation

## 2018-03-15 DIAGNOSIS — W01198A Fall on same level from slipping, tripping and stumbling with subsequent striking against other object, initial encounter: Secondary | ICD-10-CM | POA: Insufficient documentation

## 2018-03-15 MED ORDER — LIDOCAINE 5 % EX PTCH
1.0000 | MEDICATED_PATCH | CUTANEOUS | Status: DC
  Administered 2018-03-15: 1 via TRANSDERMAL
  Filled 2018-03-15: qty 1

## 2018-03-15 MED ORDER — LIDOCAINE 5 % EX PTCH: 1.0000 | MEDICATED_PATCH | Freq: Two times a day (BID) | CUTANEOUS | 0 refills | Status: AC

## 2018-03-15 MED ORDER — TRAMADOL HCL 50 MG PO TABS
50.0000 mg | ORAL_TABLET | Freq: Once | ORAL | Status: AC
  Administered 2018-03-15: 50 mg via ORAL
  Filled 2018-03-15: qty 1

## 2018-03-15 MED ORDER — TRAMADOL HCL 50 MG PO TABS: 50.0000 mg | ORAL_TABLET | Freq: Four times a day (QID) | ORAL | 0 refills | Status: AC | PRN

## 2018-03-15 NOTE — ED Triage Notes (Signed)
R chest wall pain since fell against bumper 9 days ago. Pain increases with inspiration.

## 2018-03-15 NOTE — ED Notes (Signed)
See triage note  Presents s/p fall  States he fell into truck bumper last Thursday  Pain is mainly anterior right chest

## 2018-03-15 NOTE — ED Provider Notes (Signed)
[facility] Emergency Department Provider Note   ____________________________________________   First MD Initiated Contact with Patient 03/15/18 [phone]     (approximate)  I have reviewed the triage vital signs and the nursing notes.   HISTORY  Chief Complaint Rib Injury    HPI Jack Sullivan is a 43 y.o. male patient complain of right lateral rib pain secondary to falling against the bumper 9 days ago.  Patient state pain increased with deep inspirations.  Patient denies shortness of breath.  Patient rates pain as a 5/10.  Patient described the pain as "achy".  No relief over-the-counter anti-inflammatory medications.  Patient is on Xarelto secondary prophylaxis for history of DVT.  Past Medical History:  Diagnosis Date  . Chronic pain   . Depression   . GERD (gastroesophageal reflux disease)   . Hypertension   . Morbid obesity (HCC)   . OSA (obstructive sleep apnea)    sleep study done 05/2012  . PE (pulmonary embolism) Nov 2013   bilateral following R leg injury  . 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 Peripheral neuropathy   . Personal history of pulmonary embolism 12/28/2011   bilateral following R leg injury     Patient Active Problem List   Diagnosis Date Noted  . Calculus of gallbladder with acute cholecystitis without obstruction   . Umbilical hernia without obstruction and without gangrene   . RUQ pain   . Epigastric abdominal pain 04/12/2016  . Epigastric pain   . Other specified diseases of esophagus   . Insomnia 07/17/2015  . Moderate major depression, single episode (HCC) 07/15/2015  . Fatigue 05/02/2015  . Morbid obesity (HCC)   . GERD (gastroesophageal reflux disease)   . Hypertension   . Peripheral neuropathy   . Chronic pain   . OSA (obstructive sleep apnea)   . Chest pain, unspecified 05/06/2012  . Pulmonary embolism (HCC) 05/06/2012  . Personal history of pulmonary embolism 12/28/2011    Past  Surgical History:  Procedure Laterality Date  . CARDIOVASCULAR STRESS TEST  11/2012   normal  . CHOLECYSTECTOMY N/A 05/03/2017   Procedure: LAPAROSCOPIC CHOLECYSTECTOMY;  Surgeon: Leafy RoPabon, Diego F, MD;  Location: [facility] ORS;  Service: General;  Laterality: N/A;  . ESOPHAGOGASTRODUODENOSCOPY (EGD) WITH PROPOFOL N/A 04/12/2016   Procedure: ESOPHAGOGASTRODUODENOSCOPY (EGD) WITH PROPOFOL;  Surgeon: Midge Miniumarren Wohl, MD;  Location: [facility] ENDOSCOPY;  Service: Endoscopy;  Laterality: N/A;  . HERNIA REPAIR      Prior to Admission medications   Medication Sig Start Date End Date Taking? Authorizing Provider  amLODipine (NORVASC) 5 MG tablet Take 1 tablet (5 mg total) by mouth daily. 02/03/16   Lada, Janit BernMelinda P, MD  escitalopram (LEXAPRO) 20 MG tablet Take 20 mg by mouth daily. 06/14/16   [provider]  famotidine (PEPCID) 40 MG tablet Take 1 tablet (40 mg total) by mouth every evening. Patient not taking: Reported on 04/12/2016 12/29/15 12/28/16  Jeanmarie PlantMcShane, James A, MD  lidocaine (LIDODERM) 5 % Place 1 patch onto the skin every 12 (twelve) hours. Remove & Discard patch within 12 hours or as directed by MD 03/15/18 03/15/19  Joni ReiningSmith, Eshika Reckart K, PA-C  lisinopril (PRINIVIL,ZESTRIL) 20 MG tablet Take 1 tablet (20 mg total) by mouth daily. 02/03/16   Kerman PasseyLada, Melinda P, MD  omeprazole (PRILOSEC) 20 MG capsule Take 1 capsule (20 mg total) by mouth 2 (two) times daily before a meal. If needed; long-term use carries risk; talk to your doctor Patient taking  differently: Take 40 mg by mouth daily. If needed; long-term use carries risk; talk to your doctor 02/03/16   Kerman Passey, MD  Rivaroxaban (XARELTO) 15 MG TABS tablet Take 1 tablet by mouth 1 day or 1 dose.    [provider]  traMADol (ULTRAM) 50 MG tablet Take 1 tablet (50 mg total) by mouth every 6 (six) hours as needed. 03/15/18 03/15/19  Joni Reining, PA-C  traZODone (DESYREL) 50 MG tablet Take 1-2 tablets (50-100 mg total) by mouth at bedtime as needed for  sleep. 08/12/15   Kerman Passey, MD  venlafaxine XR (EFFEXOR-XR) 150 MG 24 hr capsule Take 1 capsule (150 mg total) by mouth daily with breakfast. (this replaces Cymbalta / duloxetine) 08/18/15   Lada, Janit Bern, MD  XARELTO 20 MG TABS tablet TAKE 1 TABLET BY MOUTH ONCE DAILY WITH SUPPER 02/03/16   Lada, Janit Bern, MD    Allergies Vicodin [hydrocodone-acetaminophen]  Family History  Problem Relation Age of Onset  . Heart disease Father   . Hypertension Father   . Lung cancer Mother   . Diabetes Paternal Grandfather   . Heart disease Paternal Grandfather   . Hypertension Paternal Grandfather   . Cancer Neg Hx   . COPD Neg Hx   . Stroke Neg Hx     Social History Social History   Tobacco Use  . Smoking status: Former Smoker    Packs/day: 1.00    Years: 20.00    Pack years: 20.00    Types: Cigarettes    Last attempt to quit: 02/27/2008    Years since quitting: 10.0  . Smokeless tobacco: Never Used  Substance Use Topics  . Alcohol use: No  . Drug use: No    Review of Systems Constitutional: No fever/chills Eyes: No visual changes. ENT: No sore throat. Cardiovascular: Denies chest pain. Respiratory: Denies shortness of breath. Gastrointestinal: No abdominal pain.  No nausea, no vomiting.  No diarrhea.  No constipation. Genitourinary: Negative for dysuria. Musculoskeletal: Right lateral chest wall pain. Skin: Negative for rash. Neurological: Negative for headaches, focal weakness or numbness. Endocrine:Hypertension. Allergic/Immunilogical: Vicodin. ____________________________________________   PHYSICAL EXAM:  VITAL SIGNS: ED Triage Vitals  Enc Vitals Group     BP 03/15/18 0729 116/70     Pulse Rate 03/15/18 0729 66     Resp 03/15/18 0729 20     Temp 03/15/18 0729 (!) 97.5 F (36.4 C)     Temp Source 03/15/18 0729 Oral     SpO2 03/15/18 0729 99 %     Weight 03/15/18 0731 (!) 320 lb (145.2 kg)     Height 03/15/18 0731 5\' 8"  (1.727 m)     Head Circumference --       Peak Flow --      Pain Score 03/15/18 0731 5     Pain Loc --      Pain Edu? --      Excl. in GC? --    Constitutional: Alert and oriented. Well appearing and in no acute distress.  Morbid obesity. Neck: No stridor.  . Cardiovascular: Normal rate, regular rhythm. Grossly normal heart sounds.  Good peripheral circulation. Respiratory: Mild splinting right lateral rib area with inspiration.  No retractions. Lungs CTAB. Gastrointestinal: Soft and nontender. No distention. No abdominal bruits. No CVA tenderness. Neurologic:  Normal speech and language. No gross focal neurologic deficits are appreciated. No gait instability. Skin:  Skin is warm, dry and intact. No rash noted.  No abrasions or ecchymosis.  Psychiatric: Mood and affect are normal. Speech and behavior are normal.  ____________________________________________   LABS (all labs ordered are listed, but only abnormal results are displayed)  Labs Reviewed - No data to display ____________________________________________  EKG   ____________________________________________  RADIOLOGY  ED MD interpretation:    Official radiology report(s): Dg Ribs Unilateral W/chest Right  Result Date: 03/15/2018 CLINICAL DATA:  Pain following fall EXAM: RIGHT RIBS AND CHEST - 3+ VIEW COMPARISON:  Chest radiograph and chest CT May 01, 2017 FINDINGS: Frontal chest as well as oblique and cone-down rib images were obtained. There is a small right pleural effusion. Lungs elsewhere are clear. Heart size and pulmonary vascular normal. No adenopathy. There is no evident pneumothorax. No appreciable rib fracture. IMPRESSION: No evident rib fracture. No pneumothorax. There is a small right pleural effusion. No edema or consolidation. Electronically Signed   By: Bretta BangWilliam  Woodruff III M.D.   On: 03/15/2018 08:10    ____________________________________________   PROCEDURES  Procedure(s) performed: None  Procedures  Critical Care performed:  No  ____________________________________________   INITIAL IMPRESSION / ASSESSMENT AND PLAN / ED COURSE  As part of my medical decision making, I reviewed the following data within the electronic MEDICAL RECORD NUMBER    Patient presents with right lateral knee pain secondary to contusion 9 days ago.  Discussed negative x-ray findings with patient.  Patient given discharge care instructions and advised [facility] condition [facility].      ____________________________________________   FINAL CLINICAL IMPRESSION(S) / ED DIAGNOSES  Final diagnoses:  Rib contusion, right, initial encounter     ED Discharge Orders         Ordered    traMADol (ULTRAM) 50 MG tablet  Every 6 hours PRN     03/15/18 0823    lidocaine (LIDODERM) 5 %  Every 12 hours     03/15/18 [id number]           Note:  This document was prepared using Dragon voice recognition software and may include unintentional dictation errors.    Joni ReiningSmith, Illyria Sobocinski K, PA-C 03/15/18 [phone]    Minna AntisPaduchowski, Kevin, MD 03/19/18 [phone]

## 2021-07-03 ENCOUNTER — Emergency Department: Payer: 59

## 2021-07-03 ENCOUNTER — Emergency Department
Admission: EM | Admit: 2021-07-03 | Discharge: 2021-07-03 | Disposition: A | Discharge status: Home / Self Care | Payer: 59 | Attending: Emergency Medicine | Admitting: Emergency Medicine

## 2021-07-03 ENCOUNTER — Other Ambulatory Visit: Payer: Self-pay

## 2021-07-03 DIAGNOSIS — S0990XA Unspecified injury of head, initial encounter: Secondary | ICD-10-CM

## 2021-07-03 DIAGNOSIS — W28XXXA Contact with powered lawn mower, initial encounter: Secondary | ICD-10-CM | POA: Diagnosis not present

## 2021-07-03 DIAGNOSIS — S0101XA Laceration without foreign body of scalp, initial encounter: Secondary | ICD-10-CM | POA: Insufficient documentation

## 2021-07-03 DIAGNOSIS — Z7901 Long term (current) use of anticoagulants: Secondary | ICD-10-CM | POA: Insufficient documentation

## 2021-07-03 DIAGNOSIS — I10 Essential (primary) hypertension: Secondary | ICD-10-CM | POA: Diagnosis not present

## 2021-07-03 NOTE — ED Triage Notes (Signed)
Reports takes Eliquis.  ?

## 2021-07-03 NOTE — ED Provider Notes (Signed)
? ?[facility] ?Provider Note ? ? ? Event Date/Time  ? First MD Initiated Contact with Patient 07/03/21 2025   ?  (approximate) ? ? ?History  ? ?Head Injury ? ?HPI ? ?Jack Sullivan is a 46 y.o. male with past medical history of pulmonary embolism on Eliquis obesity hypertension who presents after head injury.  Patient was working on a lawnmower when the trunk hit him on the head.  Did not lose consciousness.  Had a laceration with significant bleeding afterward which is now stopped.  He endorses headache and some lightheadedness dizziness but denies vision change numbness tingling weakness.  No neck pain. ? ?  ? ?Past Medical History:  ?Diagnosis Date  ? Chronic pain   ? Depression   ? GERD (gastroesophageal reflux disease)   ? Hypertension   ? Morbid obesity (HCC)   ? OSA (obstructive sleep apnea)   ? sleep study done 05/2012  ? PE (pulmonary embolism) Nov 2013  ? bilateral following R leg injury  ? PE (pulmonary embolism) Sept 2014  ? recurrent bilateral PE's  ? Peripheral neuropathy   ? Personal history of pulmonary embolism 12/28/2011  ? bilateral following R leg injury   ? ? ?Patient Active Problem List  ? Diagnosis Date Noted  ? Calculus of gallbladder with acute cholecystitis without obstruction   ? Umbilical hernia without obstruction and without gangrene   ? RUQ pain   ? Epigastric abdominal pain 04/12/2016  ? Epigastric pain   ? Other specified diseases of esophagus   ? Insomnia 07/17/2015  ? Moderate major depression, single episode (HCC) 07/15/2015  ? Fatigue 05/02/2015  ? Morbid obesity (HCC)   ? GERD (gastroesophageal reflux disease)   ? Hypertension   ? Peripheral neuropathy   ? Chronic pain   ? OSA (obstructive sleep apnea)   ? Chest pain, unspecified 05/06/2012  ? Pulmonary embolism (HCC) 05/06/2012  ? Personal history of pulmonary embolism 12/28/2011  ? ? ? ?Physical Exam  ?Triage Vital Signs: ?ED Triage Vitals  ?Enc Vitals Group  ?   BP 07/03/21 1907 (!) 142/99  ?   Pulse Rate  07/03/21 1907 97  ?   Resp 07/03/21 1907 20  ?   Temp 07/03/21 1907 98.5 ?F (36.9 ?C)  ?   Temp Source 07/03/21 1907 Oral  ?   SpO2 07/03/21 1907 98 %  ?   Weight 07/03/21 1907 (!) 324 lb (147 kg)  ?   Height 07/03/21 1907 5\' 8"  (1.727 m)  ?   Head Circumference --   ?   Peak Flow --   ?   Pain Score 07/03/21 1921 10  ?   Pain Loc --   ?   Pain Edu? --   ?   Excl. in GC? --   ? ? ?Most recent vital signs: ?Vitals:  ? 07/03/21 1907  ?BP: (!) 142/99  ?Pulse: 97  ?Resp: 20  ?Temp: 98.5 ?F (36.9 ?C)  ?SpO2: 98%  ? ? ? ?General: Awake, no distress.  ?CV:  Good peripheral perfusion.  ?Resp:  Normal effort.  ?Abd:  No distention.  ?Neuro:             Awake, Alert, Oriented x 3  ?Other:  Aox3, nml speech  ?PERRL, EOMI, face symmetric, nml tongue movement  ?5/5 strength in the BL upper and lower extremities  ?Sensation grossly intact in the BL upper and lower extremities  ?Finger-nose-finger intact BL ? ?No midline bony C  spine ttp ? ?Approximately 5 cm linear well approximated laceration on the top of the scalp ? ? ?ED Results / Procedures / Treatments  ?Labs ?(all labs ordered are listed, but only abnormal results are displayed) ?Labs Reviewed - No data to display ? ? ?EKG ? ? ? ? ?RADIOLOGY ? ? ? ?PROCEDURES: ? ?Critical Care performed: No ? ?Marland Kitchen.Laceration Repair ? ?Date/Time: 07/03/2021 8:51 PM ?Performed by: Georga Hacking, MD ?Authorized by: Georga Hacking, MD  ? ?Consent:  ?  Consent obtained:  Verbal ?  Alternatives discussed:  No treatment ?Universal protocol:  ?  Patient identity confirmed:  Verbally with patient ?Laceration details:  ?  Location:  Scalp ?  Scalp location:  Frontal ?  Length (cm):  5 ?Exploration:  ?  Limited defect created (wound extended): no   ?  Contaminated: no   ?Treatment:  ?  Area cleansed with:  Saline ?  Amount of cleaning:  Standard ?  Irrigation solution:  Sterile saline ?  Irrigation method:  Syringe ?  Visualized foreign bodies/material removed: no   ?  Debridement:  None ?   Undermining:  None ?  Scar revision: no   ?Skin repair:  ?  Repair method:  Tissue adhesive ?Approximation:  ?  Approximation:  Close ?Repair type:  ?  Repair type:  Simple ?Post-procedure details:  ?  Dressing:  Open (no dressing) ?  Procedure completion:  Tolerated well, no immediate complications ? ? ?MEDICATIONS ORDERED IN ED: ?Medications - No data to display ? ? ?IMPRESSION / MDM / ASSESSMENT AND PLAN / ED COURSE  ?I reviewed the triage vital signs and the nursing notes. ?             ?               ? ?Differential diagnosis includes, but is not limited to, concussion, intracranial hemorrhage, skull fracture, superficial laceration ? ?Patient is a 46 year old male on Eliquis who presents after head injury.  The trunk of a lawnmower hit him in the head.  Did not lose consciousness.  He has headache but no neurologic symptoms and his neurologic exam is intact.  He has no midline C-spine tenderness.  CT head is negative for skull fracture or intracranial hemorrhage.  He is up-to-date on tetanus.  Laceration was cleaned by myself and repaired with skin glue as it was quite well approximated.  Did not violate the galea.  Discussed general precautions around concussion.  Work note provided.  He is appropriate for discharge. ? ?  ? ? ?FINAL CLINICAL IMPRESSION(S) / ED DIAGNOSES  ? ?Final diagnoses:  ?Injury of head, initial encounter  ?Laceration of scalp, initial encounter  ? ? ? ?Rx / DC Orders  ? ?ED Discharge Orders   ? ? None  ? ?  ? ? ? ?Note:  This document was prepared using Dragon voice recognition software and may include unintentional dictation errors. ?  ?Georga Hacking, MD ?07/03/21 2053 ? ?

## 2021-07-03 NOTE — ED Triage Notes (Signed)
Pt presents via POV c/o head pain and bleeding. Reports hood of lawnmower was slammed on his head while working on the lawnmower. Denies LOC.  ?

## 2021-07-03 NOTE — ED Provider Triage Note (Signed)
Emergency Medicine Provider Triage Evaluation Note ? ?Jack Sullivan , a 46 y.o. male  was evaluated in triage.  Pt complains of head injury and laceration.  Resents to the ED after he was hit in the head with a 0 turn lawnmower hood.  Since that time he has had some headache and dizziness but denies any nausea, vomiting, or vision loss.  Patient is on a blood thinner for his PE history, but presents with bleeding controlled with a scalp laceration. ? ?Review of Systems  ?Positive: Head injury, scalp lac ?Negative: LOC, vision change ? ?Physical Exam  ?BP (!) 142/99 (BP Location: Left Arm)   Pulse 97   Temp 98.5 ?F (36.9 ?C) (Oral)   Resp 20   Ht 5\' 8"  (1.727 m)   Wt (!) 147 kg   SpO2 98%   BMI 49.26 kg/m?  ?Gen:   Awake, no distress   ?Resp:  Normal effort  ?MSK:   Moves extremities without difficulty  ?SKIN::  3 cm linear scalp lac with a bleeding controlled ? ?Medical Decision Making  ?Medically screening exam initiated at 7:22 PM.  Appropriate orders placed.  Jack Sullivan was informed that the remainder of the evaluation will be completed by another provider, this initial triage assessment does not replace that evaluation, and the importance of remaining in the ED until their evaluation is complete. ? ?Patient to the ED for minor head injury and scalp laceration.  He presents after he was hit in the head with a lawnmower hood.  He denies any loss of consciousness. ?  ?[phone], PA-C ?07/03/21 1927 ? ?

## 2021-09-09 ENCOUNTER — Encounter: Payer: Self-pay | Admitting: Emergency Medicine

## 2021-09-09 ENCOUNTER — Other Ambulatory Visit: Payer: Self-pay

## 2021-09-09 ENCOUNTER — Emergency Department: Payer: 59

## 2021-09-09 ENCOUNTER — Emergency Department
Admission: EM | Admit: 2021-09-09 | Discharge: 2021-09-09 | Disposition: A | Discharge status: Home / Self Care | Payer: 59 | Attending: Emergency Medicine | Admitting: Emergency Medicine

## 2021-09-09 DIAGNOSIS — X500XXA Overexertion from strenuous movement or load, initial encounter: Secondary | ICD-10-CM | POA: Diagnosis not present

## 2021-09-09 DIAGNOSIS — M545 Low back pain, unspecified: Secondary | ICD-10-CM | POA: Insufficient documentation

## 2021-09-09 DIAGNOSIS — K439 Ventral hernia without obstruction or gangrene: Secondary | ICD-10-CM | POA: Diagnosis not present

## 2021-09-09 DIAGNOSIS — M5459 Other low back pain: Secondary | ICD-10-CM | POA: Diagnosis not present

## 2021-09-09 DIAGNOSIS — Y990 Civilian activity done for income or pay: Secondary | ICD-10-CM | POA: Insufficient documentation

## 2021-09-09 DIAGNOSIS — K449 Diaphragmatic hernia without obstruction or gangrene: Secondary | ICD-10-CM | POA: Diagnosis not present

## 2021-09-09 DIAGNOSIS — Y92513 Shop (commercial) as the place of occurrence of the external cause: Secondary | ICD-10-CM | POA: Insufficient documentation

## 2021-09-09 DIAGNOSIS — Z7901 Long term (current) use of anticoagulants: Secondary | ICD-10-CM | POA: Diagnosis not present

## 2021-09-09 DIAGNOSIS — K429 Umbilical hernia without obstruction or gangrene: Secondary | ICD-10-CM | POA: Diagnosis not present

## 2021-09-09 MED ORDER — CYCLOBENZAPRINE HCL 10 MG PO TABS: 10.0000 mg | ORAL_TABLET | Freq: Three times a day (TID) | ORAL | 0 refills | Status: AC

## 2021-09-09 MED ORDER — KETOROLAC TROMETHAMINE 60 MG/2ML IM SOLN
60.0000 mg | Freq: Once | INTRAMUSCULAR | Status: AC
  Administered 2021-09-09: 60 mg via INTRAMUSCULAR
  Filled 2021-09-09: qty 2

## 2021-09-09 MED ORDER — DICLOFENAC SODIUM 1 % EX GEL: 4.0000 g | Freq: Three times a day (TID) | CUTANEOUS | 0 refills | Status: AC

## 2021-09-09 MED ORDER — OXYCODONE-ACETAMINOPHEN 5-325 MG PO TABS: 1.0000 | ORAL_TABLET | Freq: Four times a day (QID) | ORAL | 0 refills | Status: AC | PRN

## 2021-09-09 MED ORDER — METHOCARBAMOL 500 MG PO TABS
750.0000 mg | ORAL_TABLET | Freq: Once | ORAL | Status: AC
  Administered 2021-09-09: 750 mg via ORAL
  Filled 2021-09-09: qty 2

## 2021-09-09 MED ORDER — HYDROMORPHONE HCL 1 MG/ML IJ SOLN
1.0000 mg | Freq: Once | INTRAMUSCULAR | Status: AC
  Administered 2021-09-09: 1 mg via INTRAMUSCULAR
  Filled 2021-09-09: qty 1

## 2021-09-09 NOTE — Discharge Instructions (Addendum)
Take the muscle relaxant and pain medication as prescribed.  The Voltaren gel is a topical pain medication that you can apply to the area of pain.

## 2021-09-09 NOTE — ED Provider Notes (Signed)
[facility] Provider Note    Event Date/Time   First MD Initiated Contact with Patient 09/09/21 1106     (approximate)   History   Back Pain   HPI  Jack Sullivan is a 46 y.o. male here with lower back pain.  The patient states that for the last several days, he has had aching, throbbing, stiff, lower back pain.  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  Denies any direct injury however.  No falls.  States that he had gradual onset of initially mild and progressively worsening lower back pain.  The pain is severe, now spasm-like, and worse with any kind of movement.  He states that he can just move slightly and feels a significant spasm-like pain in his back.  It does not radiate down his legs.  No numbness or weakness.  No loss of bowel or bladder function.  No fevers or chills.  No history of IV drug use or previous instrumentation.  No other complaints.     Physical Exam   Triage Vital Signs: ED Triage Vitals  Enc Vitals Group     BP 09/09/21 1036 (!) 160/108     Pulse Rate 09/09/21 1036 74     Resp 09/09/21 1036 18     Temp 09/09/21 1036 (!) 97.5 F (36.4 C)     Temp Source 09/09/21 1036 Oral     SpO2 09/09/21 1036 99 %     Weight 09/09/21 1035 (!) 325 lb (147.4 kg)     Height 09/09/21 1035 5\' 8"  (1.727 m)     Head Circumference --      Peak Flow --      Pain Score 09/09/21 1035 7     Pain Loc --      Pain Edu? --      Excl. in GC? --     Most recent vital signs: Vitals:   09/09/21 1256 09/09/21 1257  BP:  (!) 145/91  Pulse: 69   Resp:  18  Temp:    SpO2: 97%      General: Awake, no distress.  CV:  Good peripheral perfusion.  Resp:  Normal effort.  Abd:  No distention.  Other:  Moderate paraspinal tenderness in the lower lumbar spine.  No midline tenderness.  No tenderness along the sacrum.  Strength 5 out of 5 bilateral lower extremities with normal  sensation to light touch.  Quad reflexes 2+ and symmetric.  No clonus.   ED Results / Procedures / Treatments   Labs (all labs ordered are listed, but only abnormal results are displayed) Labs Reviewed - No data to display   EKG    RADIOLOGY    I also independently reviewed and agree with radiologist interpretations.   PROCEDURES:  Critical Care performed: No   MEDICATIONS ORDERED IN ED: Medications  HYDROmorphone (DILAUDID) injection 1 mg (1 mg Intramuscular Given 09/09/21 1141)  ketorolac (TORADOL) injection 60 mg (60 mg Intramuscular Given 09/09/21 1139)  methocarbamol (ROBAXIN) tablet 750 mg (750 mg Oral Given 09/09/21 1141)     IMPRESSION / MDM / ASSESSMENT AND PLAN / ED COURSE  I reviewed the triage vital signs and the nursing notes.                               The patient is on  the cardiac monitor to evaluate for evidence of arrhythmia and/or significant heart rate changes.   Ddx:  Differential includes the following, with pertinent life- or limb-threatening emergencies considered:  Lumbar back strain with spasm, arthritis, zoster, less likely referred abdominal source, nephrolithiasis, no signs of cauda equina  Patient's presentation is most consistent with acute presentation with potential threat to life or bodily function.  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  Patient has no lower extremity weakness, numbness, or signs to suggest radiculopathy or cord compression.  No evidence of cauda equina.  He has no history of IV drug use, instrumentation, or other red flag symptoms.  Patient given IM analgesia with significant improvement.  Pain has been present for less than 1 week.  No abdominal pain or symptoms to suggest referred abdominal or aortic source.  On review of records, however, the patient does have a history of PE on anticoagulation.  As mentioned, no lower extremity weakness or  signs to suggest epidural abscess.  However, given this, CT stone as well as L-spine obtained to evaluate for retroperitoneal hematoma or other spontaneous bleed.  This was reviewed by me and negative. Will place on symptomatic treatment, minimize lifting, return precautions given.   MEDICATIONS GIVEN IN ED: Medications  HYDROmorphone (DILAUDID) injection 1 mg (1 mg Intramuscular Given 09/09/21 1141)  ketorolac (TORADOL) injection 60 mg (60 mg Intramuscular Given 09/09/21 1139)  methocarbamol (ROBAXIN) tablet 750 mg (750 mg Oral Given 09/09/21 1141)     Consults:  None   EMR reviewed  Reviewed prior family medicine notes from [facility], weight management notes     FINAL CLINICAL IMPRESSION(S) / ED DIAGNOSES   Final diagnoses:  Acute midline low back pain without sciatica     Rx / DC Orders   ED Discharge Orders          Ordered    oxyCODONE-acetaminophen (PERCOCET) 5-325 MG tablet  Every 6 hours PRN        09/09/21 1556    cyclobenzaprine (FLEXERIL) 10 MG tablet  3 times daily        09/09/21 1556    diclofenac Sodium (VOLTAREN) 1 % GEL  3 times daily        09/09/21 1556             Note:  This document was prepared using Dragon voice recognition software and may include unintentional dictation errors.   Shaune Pollack, MD 09/09/21 [phone]

## 2021-09-09 NOTE — ED Triage Notes (Signed)
Pt via POV from home. Pt c/o sharp pain across his lower back non-radiating that started couple of days ago. Pt does heavy lifting at work but states that he hasn't done anything out of the ordinary that could be the cause of his pain. Pt is A&OX4 and NAD. Ambulatory to triage.

## 2021-09-09 NOTE — ED Notes (Signed)
See triage note. Pt to ED for lower back pain. Ambulatory to room with steady gait.

## 2022-08-01 ENCOUNTER — Other Ambulatory Visit: Payer: Self-pay

## 2022-08-01 ENCOUNTER — Encounter: Payer: Self-pay | Admitting: Emergency Medicine

## 2022-08-01 ENCOUNTER — Emergency Department: Payer: 59

## 2022-08-01 ENCOUNTER — Emergency Department
Admission: EM | Admit: 2022-08-01 | Discharge: 2022-08-01 | Disposition: A | Discharge status: Home / Self Care | Payer: 59 | Attending: Emergency Medicine | Admitting: Emergency Medicine

## 2022-08-01 DIAGNOSIS — R519 Headache, unspecified: Secondary | ICD-10-CM | POA: Diagnosis not present

## 2022-08-01 DIAGNOSIS — R0602 Shortness of breath: Secondary | ICD-10-CM | POA: Insufficient documentation

## 2022-08-01 DIAGNOSIS — R509 Fever, unspecified: Secondary | ICD-10-CM | POA: Insufficient documentation

## 2022-08-01 DIAGNOSIS — Z1152 Encounter for screening for COVID-19: Secondary | ICD-10-CM | POA: Diagnosis not present

## 2022-08-01 DIAGNOSIS — M791 Myalgia, unspecified site: Secondary | ICD-10-CM | POA: Diagnosis not present

## 2022-08-01 DIAGNOSIS — R058 Other specified cough: Secondary | ICD-10-CM | POA: Diagnosis present

## 2022-08-01 DIAGNOSIS — R5381 Other malaise: Secondary | ICD-10-CM | POA: Diagnosis not present

## 2022-08-01 DIAGNOSIS — J111 Influenza due to unidentified influenza virus with other respiratory manifestations: Secondary | ICD-10-CM

## 2022-08-01 LAB — SARS CORONAVIRUS 2 BY RT PCR: SARS Coronavirus 2 by RT PCR: NEGATIVE

## 2022-08-01 MED ORDER — AMOXICILLIN-POT CLAVULANATE 875-125 MG PO TABS: 1.0000 | ORAL_TABLET | Freq: Two times a day (BID) | ORAL | 0 refills | Status: DC

## 2022-08-01 MED ORDER — AZITHROMYCIN 250 MG PO TABS: ORAL_TABLET | ORAL | 0 refills | Status: AC

## 2022-08-01 MED ORDER — BENZONATATE 100 MG PO CAPS: 100.0000 mg | ORAL_CAPSULE | Freq: Three times a day (TID) | ORAL | 0 refills | Status: DC | PRN

## 2022-08-01 MED ORDER — HYDROCODONE-ACETAMINOPHEN 5-325 MG PO TABS: 2.0000 | ORAL_TABLET | Freq: Four times a day (QID) | ORAL | 0 refills | Status: AC | PRN

## 2022-08-01 MED ORDER — HYDROCODONE-ACETAMINOPHEN 5-325 MG PO TABS: 2.0000 | ORAL_TABLET | Freq: Four times a day (QID) | ORAL | 0 refills | Status: DC | PRN

## 2022-08-01 MED ORDER — ONDANSETRON 4 MG PO TBDP: ORAL_TABLET | ORAL | 0 refills | Status: AC

## 2022-08-01 MED ORDER — ONDANSETRON 4 MG PO TBDP: ORAL_TABLET | ORAL | 0 refills | Status: DC

## 2022-08-01 MED ORDER — BENZONATATE 100 MG PO CAPS: 100.0000 mg | ORAL_CAPSULE | Freq: Three times a day (TID) | ORAL | 0 refills | Status: AC | PRN

## 2022-08-01 MED ORDER — AMOXICILLIN-POT CLAVULANATE 875-125 MG PO TABS: 1.0000 | ORAL_TABLET | Freq: Two times a day (BID) | ORAL | 0 refills | Status: AC

## 2022-08-01 MED ORDER — AZITHROMYCIN 250 MG PO TABS: ORAL_TABLET | ORAL | 0 refills | Status: DC

## 2022-08-01 MED ORDER — HYDROCODONE-ACETAMINOPHEN 5-325 MG PO TABS
2.0000 | ORAL_TABLET | Freq: Once | ORAL | Status: AC
  Administered 2022-08-01: 2 via ORAL
  Filled 2022-08-01: qty 2

## 2022-08-01 NOTE — Discharge Instructions (Signed)
As we discussed, your evaluation was generally reassuring.  Most of your symptoms sound consistent with a viral illness, and your chest x-ray did not show any evidence of pneumonia tonight.  However, given the pain in the left side of your face and the possibility he may have a sinus infection in addition to possible developing pneumonia that is not yet seen on the chest x-ray, we will treat you with antibiotics.  Please understand that, as we discussed, this will likely not fix the problem, but hopefully it will not cause issues (other than possibly developing some diarrhea or stomach discomfort).  Continue taking your regular medications, and follow up with your primary care provider at the next available opportunity.  Return to the Emergency Department if you develop new or worsening symptoms that concern you.

## 2022-08-01 NOTE — ED Provider Notes (Signed)
[facility] Provider Note    Event Date/Time   First MD Initiated Contact with Patient 08/01/22 [phone]     (approximate)   History   Cough   HPI Jack Sullivan is a 47 y.o. male who presents for evaluation of about a week of symptoms that include subjective fever and chills, general malaise, generalized bodyaches, occasional shortness of breath, persistent productive cough worse at night, occasional nausea, and recently the development of pain in the left side of his face along the cheekbone.  He has had no recent trauma.  He said nothing in particular seems to make it feel better or worse.  The pain in the left side of his face is the most bothersome to right now.  He does not have any dental problems of which he is aware.  The pain is constant and does not come and go or "spasm".     Physical Exam   Triage Vital Signs: ED Triage Vitals  Enc Vitals Group     BP 08/01/22 0425 (!) 152/96     Pulse Rate 08/01/22 0425 88     Resp 08/01/22 0425 20     Temp 08/01/22 0425 97.8 F (36.6 C)     Temp Source 08/01/22 0425 Oral     SpO2 08/01/22 0425 97 %     Weight 08/01/22 0424 135.2 kg (298 lb)     Height 08/01/22 0424 1.727 m (5\' 8" )     Head Circumference --      Peak Flow --      Pain Score 08/01/22 0424 8     Pain Loc --      Pain Edu? --      Excl. in GC? --     Most recent vital signs: Vitals:   08/01/22 0515 08/01/22 0600  BP: (!) 138/98 (!) 139/100  Pulse: 87 78  Resp:    Temp:    SpO2: 95% 95%    General: Awake, does not appear to be in distress but appears uncomfortable as if from a viral illness. CV:  Good peripheral perfusion.  Regular rate and rhythm.  Normal heart sounds. Resp:  Normal effort. Speaking easily and comfortably, no accessory muscle usage nor intercostal retractions.  Lungs are clear to auscultation.  No wheezing, rales, nor rhonchi.  Frequent thick sounding cough occasionally productive of sputum. Abd:  No distention.   Morbid obesity.  No tenderness to palpation throughout the abdomen. Other:  No focal neurological deficits.  Mood and affect are generally normal and appropriate.   ED Results / Procedures / Treatments   Labs (all labs ordered are listed, but only abnormal results are displayed) Labs Reviewed  SARS CORONAVIRUS 2 BY RT PCR     RADIOLOGY I viewed and interpreted the patient's two-view chest x-ray and I see no evidence of pneumonia.   PROCEDURES:  Critical Care performed: No  Procedures    IMPRESSION / MDM / ASSESSMENT AND PLAN / ED COURSE  I reviewed the triage vital signs and the nursing notes.                              Differential diagnosis includes, but is not limited to, viral illness, community-acquired pneumonia, sinus infection, trigeminal neuralgia, less likely herpes zoster.  Patient's presentation is most consistent with acute complicated illness / injury requiring diagnostic workup.  Labs/studies ordered: 2 view chest x-ray, COVID-19 PCR.  Interventions/Medications  given:  Medications  HYDROcodone-acetaminophen (NORCO/VICODIN) 5-325 MG per tablet 2 tablet (2 tablets Oral Given 08/01/22 8295)    (Note:  hospital course my include additional interventions and/or labs/studies not listed above.)  Vital signs are stable other than hypertension.  He appears ill from a viral illness but without any overt evidence of acute bacterial infection or more emergent medical condition.  Chest x-ray clear as described above, although he says he does not smoke and the sound of his cough is quite thick and somewhat worrisome.  His COVID testing is negative.  Given the time of year there is no strong indication or benefit for influenza testing but the patient does seem to be suffering from an influenza-like illness.  Regarding his acute facial pain, sinus infection is possible.  I looked within his mouth and palpated along his teeth and gums and I neither seen or feel any  evidence of acute infection or dental injuries.  The pain seems to be along the V2 branch of the trigeminal nerve but he is not describing the paroxysms of pain that 1 typically expect from trigeminal neuralgia.  After several discussions with the patient and his wife regarding his constellation of symptoms, and discussing the risks and benefits of empiric antibiotics in the absence of a specific diagnosis, we agreed to proceed with a course of Augmentin for the possibility of sinus infection and/or early community-acquired pneumonia.  I also prescribed azithromycin because if we are treating for possible community-acquired pneumonia that is just not yet visible on chest x-ray (given the symptoms seem to be getting slightly worse after about a week), he should have multiple coverage rather than just the Augmentin.  I also wrote the other prescriptions as listed below after checking the [place] controlled substance database and verified that there are no concerning prescribing patterns.  The Norco should help as a cough suppressant as well as pain relief.  I strongly encouraged close outpatient follow-up.  He and his wife understand and agree with the plan.       FINAL CLINICAL IMPRESSION(S) / ED DIAGNOSES   Final diagnoses:  Influenza-like illness  Facial pain  Productive cough     Rx / DC Orders   ED Discharge Orders          Ordered    amoxicillin-clavulanate (AUGMENTIN) 875-125 MG tablet  2 times daily,   Status:  Discontinued        08/01/22 0627    HYDROcodone-acetaminophen (NORCO/VICODIN) 5-325 MG tablet  Every 6 hours PRN,   Status:  Discontinued        08/01/22 0627    ondansetron (ZOFRAN-ODT) 4 MG disintegrating tablet  Status:  Discontinued        08/01/22 0627    benzonatate (TESSALON PERLES) 100 MG capsule  3 times daily PRN,   Status:  Discontinued        08/01/22 0627    azithromycin (ZITHROMAX) 250 MG tablet  Status:  Discontinued        08/01/22 0632     amoxicillin-clavulanate (AUGMENTIN) 875-125 MG tablet  2 times daily        08/01/22 0643    azithromycin (ZITHROMAX) 250 MG tablet        08/01/22 0643    benzonatate (TESSALON PERLES) 100 MG capsule  3 times daily PRN        08/01/22 0643    HYDROcodone-acetaminophen (NORCO/VICODIN) 5-325 MG tablet  Every 6 hours PRN  08/01/22 0643    ondansetron (ZOFRAN-ODT) 4 MG disintegrating tablet        08/01/22 4098             Note:  This document was prepared using Dragon voice recognition software and may include unintentional dictation errors.   Loleta Rose, MD 08/01/22 [phone]

## 2022-08-01 NOTE — ED Triage Notes (Signed)
Patient ambulatory to triage with steady gait, without difficulty or distress noted; pt reports since last Wed having prod cough green sputum, nausea, body chills/aches and facial pain

## 2023-08-22 ENCOUNTER — Encounter: Payer: Self-pay | Admitting: *Deleted

## 2023-08-22 ENCOUNTER — Other Ambulatory Visit: Payer: Self-pay

## 2023-08-22 ENCOUNTER — Emergency Department
Admission: EM | Admit: 2023-08-22 | Discharge: 2023-08-22 | Disposition: A | Discharge status: Home / Self Care | Payer: Self-pay | Attending: Emergency Medicine | Admitting: Emergency Medicine

## 2023-08-22 DIAGNOSIS — X58XXXA Exposure to other specified factors, initial encounter: Secondary | ICD-10-CM | POA: Insufficient documentation

## 2023-08-22 DIAGNOSIS — S0501XA Injury of conjunctiva and corneal abrasion without foreign body, right eye, initial encounter: Secondary | ICD-10-CM | POA: Insufficient documentation

## 2023-08-22 MED ORDER — TETRACAINE HCL 0.5 % OP SOLN
2.0000 [drp] | Freq: Once | OPHTHALMIC | Status: AC
  Administered 2023-08-22: 2 [drp] via OPHTHALMIC
  Filled 2023-08-22: qty 4

## 2023-08-22 MED ORDER — CIPROFLOXACIN HCL 0.3 % OP SOLN: 2.0000 [drp] | Freq: Four times a day (QID) | OPHTHALMIC | 0 refills | Status: AC

## 2023-08-22 MED ORDER — FLUORESCEIN SODIUM 1 MG OP STRP
1.0000 | ORAL_STRIP | Freq: Once | OPHTHALMIC | Status: AC
  Administered 2023-08-22: 1 via OPHTHALMIC
  Filled 2023-08-22: qty 1

## 2023-08-22 NOTE — Discharge Instructions (Addendum)
 You were seen in the emergency department for an abrasion of your cornea.  Please pick up and take the antibiotic drops as prescribed.  Please do not use your contact lens in the eye while taking these drops.  Please use sunglasses when outside.  Please follow-up with the ophthalmologist in 48 hours.  You can also follow-up with your primary care provider in the emergency department for any new or concerning symptoms.

## 2023-08-22 NOTE — ED Triage Notes (Signed)
 Pt ambulatory to triage.  Pt has right eye pain and redness.  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  Pt alert.  Speech clear.

## 2023-08-22 NOTE — ED Provider Notes (Signed)
 [facility] Provider Note    Event Date/Time   First MD Initiated Contact with Patient 08/22/23 1642     (approximate)   History   Eye Pain   HPI  Jack Sullivan is a 48 y.o. male presenting to the emergency department with right eye pain and redness x 1 day.  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  Patient endorses foreign body sensation, significant eye tearing, erythema, some blurry vision, photophobia.  He had mild pain at the time of incident, but it has worsened since then. He does wear contacts, does not currently have the right contact in (the affected eye).  Has used at home eye contact solution but no eyedrops since incident.  Denies surrounding swelling, otalgia, sore throat, fever, nasal congestion, cough, stringy discharge or crusted discharge.  Past medical history includes morbid obesity, GERD, major depression, OSA, PE.     Physical Exam   Triage Vital Signs: ED Triage Vitals  Encounter Vitals Group     BP 08/22/23 1618 133/89     Girls Systolic BP Percentile --      Girls Diastolic BP Percentile --      Boys Systolic BP Percentile --      Boys Diastolic BP Percentile --      Pulse Rate 08/22/23 1618 96     Resp 08/22/23 1618 18     Temp 08/22/23 1618 98.5 F (36.9 C)     Temp Source 08/22/23 1618 Oral     SpO2 08/22/23 1618 100 %     Weight 08/22/23 1615 280 lb (127 kg)     Height 08/22/23 1615 5' 8 (1.727 m)     Head Circumference --      Peak Flow --      Pain Score 08/22/23 1615 10     Pain Loc --      Pain Education --      Exclude from Growth Chart --     Most recent vital signs: Vitals:   08/22/23 1618  BP: 133/89  Pulse: 96  Resp: 18  Temp: 98.5 F (36.9 C)  SpO2: 100%     General: Well-appearing, in no acute distress. Appears stated age. Head: Normocephalic, atraumatic. Eyes: PERRLA. Conjunctival injection bilaterally,  right worse than left.  Extreme tearing from the lacrimal duct on the right.  Pain with EOMs in his right eye, none on left.  Funduscopy exam deferred. Neck: Supple. Skin:Warm, dry, intact. No rashes, lesions, or ecchymosis. No cyanosis or pallor. Neurological: A&Ox4 to person, place, time, and situation.   ED Results / Procedures / Treatments   Labs (all labs ordered are listed, but only abnormal results are displayed) Labs Reviewed - No data to display   EKG     RADIOLOGY     PROCEDURES:  Critical Care performed: No  Procedures    MEDICATIONS ORDERED IN ED: Medications  tetracaine (PONTOCAINE) 0.5 % ophthalmic solution 2 drop (2 drops Right Eye Given 08/22/23 1714)  fluorescein ophthalmic strip 1 strip (1 strip Right Eye Given 08/22/23 1714)     IMPRESSION / MDM / ASSESSMENT AND PLAN / ED COURSE  I reviewed the triage vital signs and the nursing notes.                              Differential diagnosis includes,  but is not limited to, corneal abrasion, corneal ulcer, globe rupture, allergic or bacterial conjunctivitis, foreign body of the eye  Patient's presentation is most consistent with acute complicated illness / injury requiring diagnostic workup.  Patient is a 48 year old male who presented today with right eye pain following an injury that occurred at work.  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  He has significant tearing and pain with extraocular movements on the right eye.  Woods lamp was used with tetracaine for anesthesia and fluorescent strip for staining.  Fluorescein exam revealed fluorescein uptake notable in the right eye near the pupil, notable for corneal abrasion.  No evidence of Seidel sign which would warrant further workup for globe rupture.  No evidence of any foreign body seen.  Patient does wear contacts, he was provided with prescription for ciprofloxacin  eyedrops for  Pseudomonas coverage.  He was instructed to follow-up with the ophthalmologist listed in the paperwork after 48 hours, or in the emergency department for any new or worsening symptoms.  He was also instructed not to wear contact lenses while taking the antibiotics.  All vital signs within normal range.  Patient was given the opportunity to ask questions; all questions were answered. Emergency department return precautions were discussed with the patient.  Patient is in agreement to the treatment plan.  Patient is stable for discharge.     FINAL CLINICAL IMPRESSION(S) / ED DIAGNOSES   Final diagnoses:  Abrasion of right cornea, initial encounter     Rx / DC Orders   ED Discharge Orders          Ordered    ciprofloxacin  (CILOXAN ) 0.3 % ophthalmic solution  Every 6 hours        08/22/23 1742             Note:  This document was prepared using Dragon voice recognition software and may include unintentional dictation errors.    Sheron Salm, PA-C 08/22/23 1802    Dorothyann Drivers, MD 08/22/23 1904

## 2023-12-18 ENCOUNTER — Emergency Department: Payer: Self-pay

## 2023-12-18 ENCOUNTER — Encounter: Payer: Self-pay | Admitting: Emergency Medicine

## 2023-12-18 ENCOUNTER — Other Ambulatory Visit: Payer: Self-pay

## 2023-12-18 ENCOUNTER — Emergency Department
Admission: EM | Admit: 2023-12-18 | Discharge: 2023-12-18 | Disposition: A | Discharge status: Home / Self Care | Payer: Self-pay | Attending: Emergency Medicine | Admitting: Emergency Medicine

## 2023-12-18 DIAGNOSIS — Z7901 Long term (current) use of anticoagulants: Secondary | ICD-10-CM | POA: Insufficient documentation

## 2023-12-18 DIAGNOSIS — J069 Acute upper respiratory infection, unspecified: Secondary | ICD-10-CM | POA: Insufficient documentation

## 2023-12-18 DIAGNOSIS — I10 Essential (primary) hypertension: Secondary | ICD-10-CM | POA: Insufficient documentation

## 2023-12-18 LAB — RESP PANEL BY RT-PCR (RSV, FLU A&B, COVID)  RVPGX2
Influenza A by PCR: NEGATIVE
Influenza B by PCR: NEGATIVE
Resp Syncytial Virus by PCR: NEGATIVE
SARS Coronavirus 2 by RT PCR: NEGATIVE

## 2023-12-18 LAB — GROUP A STREP BY PCR: Group A Strep by PCR: NOT DETECTED

## 2023-12-18 NOTE — ED Triage Notes (Signed)
 Patient ambulatory to triage with steady gait, without difficulty or distress noted; pt reports x 2 days having nonprod cough, congestion and sore throat

## 2023-12-18 NOTE — ED Provider Notes (Signed)
 [facility] Provider Note    Event Date/Time   First MD Initiated Contact with Patient 12/18/23 [phone]     (approximate)   History   Cough   HPI  Jack Sullivan is a 48 y.o. male with PMH of PE on Eliquis, obesity, hypertension presents for evaluation of cough, congestion and sore throat.  Patient denies fevers.  Symptoms for 2 days.  Has not taken any medication for symptoms.      Physical Exam   Triage Vital Signs: ED Triage Vitals  Encounter Vitals Group     BP 12/18/23 0540 (!) 141/87     Girls Systolic BP Percentile --      Girls Diastolic BP Percentile --      Boys Systolic BP Percentile --      Boys Diastolic BP Percentile --      Pulse Rate 12/18/23 0540 83     Resp 12/18/23 0540 16     Temp 12/18/23 0540 98.7 F (37.1 C)     Temp Source 12/18/23 0540 Oral     SpO2 12/18/23 0540 100 %     Weight 12/18/23 0537 280 lb (127 kg)     Height 12/18/23 0537 5' 8 (1.727 m)     Head Circumference --      Peak Flow --      Pain Score 12/18/23 0537 6     Pain Loc --      Pain Education --      Exclude from Growth Chart --     Most recent vital signs: Vitals:   12/18/23 0540 12/18/23 0716  BP: (!) 141/87   Pulse: 83   Resp: 16   Temp: 98.7 F (37.1 C)   SpO2: 100% 100%   General: Awake, no distress.  CV:  Good peripheral perfusion.  RRR. Resp:  Normal effort.  CTAB Abd:  No distention.  Other:  Oral mucous membranes are moist, pharynx is erythematous, no tonsillar enlargement or exudates, tender lymphadenopathy submandibular left side   ED Results / Procedures / Treatments   Labs (all labs ordered are listed, but only abnormal results are displayed) Labs Reviewed  GROUP A STREP BY PCR  RESP PANEL BY RT-PCR (RSV, FLU A&B, COVID)  [id number]    RADIOLOGY  Chest x-ray obtained, I interpreted the images as well as reviewed the radiologist report, which was negative for any acute cardiopulmonary  abnormalities.  PROCEDURES:  Critical Care performed: No  Procedures   MEDICATIONS ORDERED IN ED: Medications - No data to display   IMPRESSION / MDM / ASSESSMENT AND PLAN / ED COURSE  I reviewed the triage vital signs and the nursing notes.                             48 year old male presents for evaluation of upper respiratory infection, blood pressure little bit elevated otherwise vital signs stable, patient NAD on exam.  Differential diagnosis includes, but is not limited to, flu, COVID, RSV, strep, pneumonia, bronchitis.  Patient's presentation is most consistent with acute complicated illness / injury requiring diagnostic workup.  Respiratory panel, strep testing and chest x-ray are all negative, suspect viral URI.  Recommended over-the-counter cold medicine.  Patient did not need a note for work.  He voiced understanding, all questions were answered and he was stable at discharge.    FINAL CLINICAL IMPRESSION(S) / ED DIAGNOSES   Final diagnoses:  Upper respiratory  tract infection, unspecified type     Rx / DC Orders   ED Discharge Orders     None        Note:  This document was prepared using Dragon voice recognition software and may include unintentional dictation errors.   Cleaster Tinnie LABOR, PA-C 12/18/23 9157    Dicky Anes, MD 12/18/23 1556

## 2023-12-18 NOTE — Discharge Instructions (Signed)
 You tested negative for flu, COVID, RSV and strep.  Your chest x-ray was normal.  You likely have a viral illness which will resolve on its own with time.  You do not need an antibiotic.  You can take over-the-counter cold medicine as needed to manage your symptoms.  If you are taking combination cold medicine keep in mind that this often contains Tylenol  so if you need additional medication for body aches or fever control please take Motrin or ibuprofen.  Your symptoms should resolve with time, if you have had symptoms for greater than 10 days please be evaluated by another healthcare provider as at this point it may have developed into a bacterial infection which requires a different treatment.  I recommend trying Mucinex as this will help you clear out the mucus in your chest.  Return to the emergency department with worsening symptoms.
